# Patient Record
Sex: Female | Born: 1940 | Race: Black or African American | Hispanic: No | Smoking: Never smoker
Health system: Southern US, Community
[De-identification: ages and names within clinical notes are randomized; demographics above are authoritative.]

## PROBLEM LIST (undated history)

## (undated) DIAGNOSIS — K922 Gastrointestinal hemorrhage, unspecified: Secondary | ICD-10-CM

## (undated) DIAGNOSIS — J9610 Chronic respiratory failure, unspecified whether with hypoxia or hypercapnia: Secondary | ICD-10-CM

## (undated) DIAGNOSIS — E119 Type 2 diabetes mellitus without complications: Secondary | ICD-10-CM

## (undated) DIAGNOSIS — I82409 Acute embolism and thrombosis of unspecified deep veins of unspecified lower extremity: Secondary | ICD-10-CM

## (undated) DIAGNOSIS — E079 Disorder of thyroid, unspecified: Secondary | ICD-10-CM

## (undated) DIAGNOSIS — I482 Chronic atrial fibrillation, unspecified: Secondary | ICD-10-CM

## (undated) DIAGNOSIS — D649 Anemia, unspecified: Secondary | ICD-10-CM

## (undated) DIAGNOSIS — N189 Chronic kidney disease, unspecified: Secondary | ICD-10-CM

## (undated) DIAGNOSIS — I4891 Unspecified atrial fibrillation: Secondary | ICD-10-CM

## (undated) DIAGNOSIS — E662 Morbid (severe) obesity with alveolar hypoventilation: Secondary | ICD-10-CM

## (undated) HISTORY — PX: TRACHEOSTOMY: SUR1362

## (undated) HISTORY — PX: PEG TUBE PLACEMENT: SUR1034

---
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
## 2017-07-28 ENCOUNTER — Other Ambulatory Visit (HOSPITAL_COMMUNITY): Payer: No Typology Code available for payment source

## 2017-07-28 ENCOUNTER — Inpatient Hospital Stay
Admission: AD | Admit: 2017-07-28 | Discharge: 2017-09-06 | Disposition: A | Discharge status: Home / Self Care | Payer: No Typology Code available for payment source | Source: Ambulatory Visit | Attending: Internal Medicine | Admitting: Internal Medicine

## 2017-07-28 DIAGNOSIS — J811 Chronic pulmonary edema: Secondary | ICD-10-CM

## 2017-07-28 DIAGNOSIS — J9600 Acute respiratory failure, unspecified whether with hypoxia or hypercapnia: Secondary | ICD-10-CM

## 2017-07-28 DIAGNOSIS — R52 Pain, unspecified: Secondary | ICD-10-CM

## 2017-07-28 DIAGNOSIS — I509 Heart failure, unspecified: Secondary | ICD-10-CM

## 2017-07-28 DIAGNOSIS — J9690 Respiratory failure, unspecified, unspecified whether with hypoxia or hypercapnia: Secondary | ICD-10-CM

## 2017-07-28 LAB — CBC WITH DIFFERENTIAL/PLATELET
Basophils Absolute: 0 10*3/uL (ref 0.0–0.1)
Basophils Relative: 0 %
Eosinophils Absolute: 0.5 10*3/uL (ref 0.0–0.7)
Eosinophils Relative: 6 %
HEMATOCRIT: 28.9 % — AB (ref 36.0–46.0)
HEMOGLOBIN: 9.1 g/dL — AB (ref 12.0–15.0)
LYMPHS ABS: 1.2 10*3/uL (ref 0.7–4.0)
Lymphocytes Relative: 16 %
MCH: 27.3 pg (ref 26.0–34.0)
MCHC: 31.5 g/dL (ref 30.0–36.0)
MCV: 86.8 fL (ref 78.0–100.0)
MONOS PCT: 10 %
Monocytes Absolute: 0.8 10*3/uL (ref 0.1–1.0)
NEUTROS ABS: 5 10*3/uL (ref 1.7–7.7)
NEUTROS PCT: 68 %
Platelets: 175 10*3/uL (ref 150–400)
RBC: 3.33 MIL/uL — AB (ref 3.87–5.11)
RDW: 15.9 % — ABNORMAL HIGH (ref 11.5–15.5)
WBC: 7.4 10*3/uL (ref 4.0–10.5)

## 2017-07-28 LAB — PROTIME-INR
INR: 1.54
Prothrombin Time: 18.7 seconds — ABNORMAL HIGH (ref 11.4–15.2)

## 2017-07-28 LAB — T4, FREE: FREE T4: 1.25 ng/dL — AB (ref 0.61–1.12)

## 2017-07-28 LAB — COMPREHENSIVE METABOLIC PANEL
ALT: 23 U/L (ref 14–54)
ANION GAP: 9 (ref 5–15)
AST: 27 U/L (ref 15–41)
Albumin: 3.5 g/dL (ref 3.5–5.0)
Alkaline Phosphatase: 150 U/L — ABNORMAL HIGH (ref 38–126)
BUN: 51 mg/dL — ABNORMAL HIGH (ref 6–20)
CHLORIDE: 107 mmol/L (ref 101–111)
CO2: 26 mmol/L (ref 22–32)
Calcium: 8.6 mg/dL — ABNORMAL LOW (ref 8.9–10.3)
Creatinine, Ser: 2.56 mg/dL — ABNORMAL HIGH (ref 0.44–1.00)
GFR calc non Af Amer: 17 mL/min — ABNORMAL LOW (ref >60)
GFR, EST AFRICAN AMERICAN: 20 mL/min — AB (ref >60)
Glucose, Bld: 107 mg/dL — ABNORMAL HIGH (ref 65–99)
POTASSIUM: 4.4 mmol/L (ref 3.5–5.1)
SODIUM: 142 mmol/L (ref 135–145)
Total Bilirubin: 0.5 mg/dL (ref 0.3–1.2)
Total Protein: 7 g/dL (ref 6.5–8.1)

## 2017-07-28 LAB — HEMOGLOBIN A1C
HEMOGLOBIN A1C: 5.8 % — AB (ref 4.8–5.6)
MEAN PLASMA GLUCOSE: 119.76 mg/dL

## 2017-07-28 LAB — TSH: TSH: 1.923 u[IU]/mL (ref 0.350–4.500)

## 2017-07-29 LAB — BLOOD GAS, ARTERIAL
ACID-BASE EXCESS: 1.6 mmol/L (ref 0.0–2.0)
BICARBONATE: 26.8 mmol/L (ref 20.0–28.0)
FIO2: 30
O2 Saturation: 98.4 %
PEEP: 10 cmH2O
PH ART: 7.34 — AB (ref 7.350–7.450)
Patient temperature: 98.6
RATE: 18 resp/min
VT: 480 mL
pCO2 arterial: 51 mmHg — ABNORMAL HIGH (ref 32.0–48.0)
pO2, Arterial: 115 mmHg — ABNORMAL HIGH (ref 83.0–108.0)

## 2017-07-29 LAB — PROTIME-INR
INR: 1.45
PROTHROMBIN TIME: 17.7 s — AB (ref 11.4–15.2)

## 2017-07-30 LAB — PROTIME-INR
INR: 1.41
Prothrombin Time: 17.4 seconds — ABNORMAL HIGH (ref 11.4–15.2)

## 2017-07-31 LAB — BASIC METABOLIC PANEL
ANION GAP: 8 (ref 5–15)
BUN: 44 mg/dL — AB (ref 6–20)
CO2: 27 mmol/L (ref 22–32)
Calcium: 8.3 mg/dL — ABNORMAL LOW (ref 8.9–10.3)
Chloride: 106 mmol/L (ref 101–111)
Creatinine, Ser: 2.58 mg/dL — ABNORMAL HIGH (ref 0.44–1.00)
GFR calc Af Amer: 20 mL/min — ABNORMAL LOW (ref >60)
GFR, EST NON AFRICAN AMERICAN: 17 mL/min — AB (ref >60)
GLUCOSE: 88 mg/dL (ref 65–99)
POTASSIUM: 4 mmol/L (ref 3.5–5.1)
Sodium: 141 mmol/L (ref 135–145)

## 2017-07-31 LAB — PROTIME-INR
INR: 1.33
PROTHROMBIN TIME: 16.6 s — AB (ref 11.4–15.2)

## 2017-07-31 NOTE — Consult Note (Signed)
[facility] CONSULT NOTE    Date: 07/31/2017                  Patient Name:  Shirley Malone  MRN: [id number]  DOB: July 20, 1941  Age / Sex: 76 y.o., female         PCP: System, Pcp Not In                 Service Requesting Consult: Hospitalist                 Reason for Consult: Chronic kidney disease stage IV             History of Present Illness: Patient is a 76 y.o. 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 The patient was recently admitted to [facility] in [place] from 07/18/2017 to 07/28/2017.  Patient had 4 recent falls at home. The patient's family was no longer able to provide support for the patient. When she was admitted to the outside hospital she was found have an Escherichia coli UTI which was treated with Rocephin. She also had an unexpected acute on chronic respiratory arrest with brief cardiac arrest on 07/23/2017 due to malfunctioning of the ventilator tubing. She was subsequently able to recuperate from this. We are asked to see the patient for evaluation management of chronic kidney disease stage IV. She does follow with an outpatient nephrologist. Her baseline EGFR appears to be 21 and at the moment her renal function appears to be stable.   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  Social History: Married, 4 children, used to work with chemicals.  Review of Systems: Review of Systems  Constitutional: Positive for malaise/fatigue. Negative for chills and fever.  HENT: Negative for ear discharge and tinnitus.   Eyes: Negative for blurred vision, double vision and photophobia.  Respiratory: Positive for shortness of breath. Negative for cough.   Cardiovascular: Positive for PND. Negative for chest pain and palpitations.  Gastrointestinal: Negative for heartburn, nausea and vomiting.  Genitourinary: Negative for dysuria, frequency and urgency.  Musculoskeletal: Negative for back pain and myalgias.  Skin: Negative for itching and rash.  Neurological: Positive for weakness. Negative for dizziness and focal weakness.  Endo/Heme/Allergies: Negative for polydipsia. Does not bruise/bleed easily.  Psychiatric/Behavioral: Negative for depression. The patient is nervous/anxious.      Vital Signs: Temperature 97.9 pulse 80 respirations 34 blood pressure 152/86  Weight trends: There were no vitals filed for  this visit.  Physical Exam: General: NAD, sitting up in chair  Head: Normocephalic, atraumatic.  Eyes: Anicteric, EOMI  Nose: Mucous membranes moist, not inflammed, nonerythematous.  Throat: Oropharynx nonerythematous, no exudate appreciated.   Neck: Tracheostomy in place    Lungs:  Normal respiratory effort. Clear to auscultation BL without crackles or wheezes.  Heart: RRR. S1 and S2 normal without gallop, murmur, or rubs.  Abdomen:  BS normoactive. Soft, Nondistended, non-tender.  No masses or organomegaly.  Extremities: No pretibial edema.  Neurologic: A&O X3, Motor strength is 5/5 in the all 4 extremities  Skin: No visible rashes, scars.    Lab results: Basic Metabolic Panel:  Recent Labs Lab 07/28/17 1443 07/31/17 0254  NA 142 141  K 4.4 4.0  CL 107 106  CO2 26 27  GLUCOSE 107* 88  BUN 51* 44*  CREATININE 2.56* 2.58*  CALCIUM 8.6* 8.3*    Liver Function Tests:  Recent Labs Lab 07/28/17 1443  AST 27  ALT 23  ALKPHOS 150*  BILITOT 0.5  PROT 7.0  ALBUMIN 3.5   No results for input(s): LIPASE, AMYLASE in the last 168 hours. No results for input(s): AMMONIA in the last 168 hours.  CBC:  Recent Labs Lab 07/28/17 1443  WBC 7.4  NEUTROABS 5.0  HGB 9.1*  HCT 28.9*  MCV 86.8  PLT 175    Cardiac Enzymes: No results for input(s): CKTOTAL, CKMB, CKMBINDEX, TROPONINI in the last 168 hours.  BNP: Invalid input(s): POCBNP  CBG: No results for input(s): GLUCAP in the last 168 hours.  Microbiology: No results found for this or any previous visit.  Coagulation Studies:  Recent Labs  07/29/17 0532 07/30/17 0531 07/31/17 0254  LABPROT 17.7* 17.4* 16.6*  INR 1.45 1.41 1.33    Urinalysis: No results for input(s): COLORURINE, LABSPEC, PHURINE, GLUCOSEU, HGBUR, BILIRUBINUR, KETONESUR, PROTEINUR, UROBILINOGEN, NITRITE, LEUKOCYTESUR in the last 72 hours.  Invalid input(s): APPERANCEUR    Imaging:  No results found.   Assessment & Plan: Pt is a 76 y.o. 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   Patient was originally admitted for Escherichia coli UTI and falls. She suffered a respiratory and cardiac arrest while at outside hospital. We are asked to see the patient for underlying chronic kidney disease stage IV.  1. Chronic kidney disease stage IV. At the moment it appears that the patient's chronic kidney disease is stable. Her baseline creatinine appears to be 2.5 with an EGFR 21. Therefore no indication for dialysis at the moment. We will continue to monitor renal function closely over the course of the hospitalization.  2. Anemia chronic kidney disease. Hemoglobin currently 9.1. Hold off on Aranesp for now. Continue to monitor CBC periodically.  3. Hypertension. Maintain the patient on amlodipine and metoprolol for now.  4. Acute respiratory failure. The patient had an acute respiratory failure event at the outside hospital. She has a tracheostomy in place which appears to be function well. She's had a tracheostomy in place since 2016.  5. Thanks for consultation.

## 2017-08-01 LAB — PROTIME-INR
INR: 1.37
PROTHROMBIN TIME: 17 s — AB (ref 11.4–15.2)

## 2017-08-02 LAB — PROTIME-INR
INR: 1.35
PROTHROMBIN TIME: 16.7 s — AB (ref 11.4–15.2)

## 2017-08-02 NOTE — Progress Notes (Signed)
  [facility]  ROUNDING NOTE   Subjective:  Renal function appears to be stable. Creatinine today is 2.5. Patient resting comfortably at the moment.   Objective:  Vital signs in last 24 hours:  Temperature 97.8 pulse 88 respirations 24 blood pressure 143/81  Physical Exam: General: No acute distress  Head: Normocephalic, atraumatic. Moist oral mucosal membranes  Eyes: Anicteric  Neck: Tracheostomy in place   Lungs:  Clear to auscultation, normal effort  Heart: S1S2 no rubs  Abdomen:  Soft, nontender, bowel sounds present  Extremities: Trace peripheral edema.  Neurologic: Awake, alert, following commands  Skin: No lesions       Basic Metabolic Panel:  Recent Labs Lab 07/28/17 1443 07/31/17 0254  NA 142 141  K 4.4 4.0  CL 107 106  CO2 26 27  GLUCOSE 107* 88  BUN 51* 44*  CREATININE 2.56* 2.58*  CALCIUM 8.6* 8.3*    Liver Function Tests:  Recent Labs Lab 07/28/17 1443  AST 27  ALT 23  ALKPHOS 150*  BILITOT 0.5  PROT 7.0  ALBUMIN 3.5   No results for input(s): LIPASE, AMYLASE in the last 168 hours. No results for input(s): AMMONIA in the last 168 hours.  CBC:  Recent Labs Lab 07/28/17 1443  WBC 7.4  NEUTROABS 5.0  HGB 9.1*  HCT 28.9*  MCV 86.8  PLT 175    Cardiac Enzymes: No results for input(s): CKTOTAL, CKMB, CKMBINDEX, TROPONINI in the last 168 hours.  BNP: Invalid input(s): POCBNP  CBG: No results for input(s): GLUCAP in the last 168 hours.  Microbiology: No results found for this or any previous visit.  Coagulation Studies:  Recent Labs  07/31/17 0254 08/01/17 0525 08/02/17 0407  LABPROT 16.6* 17.0* 16.7*  INR 1.33 1.37 1.35    Urinalysis: No results for input(s): COLORURINE, LABSPEC, PHURINE, GLUCOSEU, HGBUR, BILIRUBINUR, KETONESUR, PROTEINUR, UROBILINOGEN, NITRITE, LEUKOCYTESUR in the last 72 hours.  Invalid input(s): APPERANCEUR    Imaging: No results found.   Medications:       Assessment/  Plan:  76 y.o. 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   Patient was originally admitted for Escherichia coli UTI and falls. She suffered a respiratory and cardiac arrest while at outside hospital. We are asked to see the patient for underlying chronic kidney disease stage IV.  1. Chronic kidney disease stage IV. Chronic kidney disease remains stable at [facility] a creatinine of 2.5 and EGFR 21. We will continue to monitor renal function periodically.  2. Anemia chronic kidney disease. Hold off on Aranesp for now.  3. Hypertension. Blood pressure under reasonable control at the moment. Continue current doses of amlodipine and metoprolol.  4. Acute respiratory failure. Patient appears to be breathing comfortably with tracheostomy in place. Management as per hospitalist.   LOS: 0 Shirley Malone 8/22/20184:32 PM

## 2017-08-03 LAB — PROTIME-INR
INR: 1.42
Prothrombin Time: 17.5 seconds — ABNORMAL HIGH (ref 11.4–15.2)

## 2017-08-04 LAB — PROTIME-INR
INR: 1.4
PROTHROMBIN TIME: 17.2 s — AB (ref 11.4–15.2)

## 2017-08-04 NOTE — Progress Notes (Signed)
Patient's renal function has been stable, she has underlying stage IV CKD.  No further input at this time, please call back if renal function worse.

## 2017-08-05 LAB — PROTIME-INR
INR: 1.38
PROTHROMBIN TIME: 17.1 s — AB (ref 11.4–15.2)

## 2017-08-06 LAB — CBC
HCT: 26 % — ABNORMAL LOW (ref 36.0–46.0)
HEMOGLOBIN: 7.9 g/dL — AB (ref 12.0–15.0)
MCH: 26.4 pg (ref 26.0–34.0)
MCHC: 30.4 g/dL (ref 30.0–36.0)
MCV: 87 fL (ref 78.0–100.0)
PLATELETS: 135 10*3/uL — AB (ref 150–400)
RBC: 2.99 MIL/uL — AB (ref 3.87–5.11)
RDW: 15.8 % — ABNORMAL HIGH (ref 11.5–15.5)
WBC: 6.6 10*3/uL (ref 4.0–10.5)

## 2017-08-06 LAB — BASIC METABOLIC PANEL
ANION GAP: 9 (ref 5–15)
BUN: 40 mg/dL — AB (ref 6–20)
CHLORIDE: 107 mmol/L (ref 101–111)
CO2: 26 mmol/L (ref 22–32)
Calcium: 8.7 mg/dL — ABNORMAL LOW (ref 8.9–10.3)
Creatinine, Ser: 2.7 mg/dL — ABNORMAL HIGH (ref 0.44–1.00)
GFR calc Af Amer: 19 mL/min — ABNORMAL LOW (ref >60)
GFR calc non Af Amer: 16 mL/min — ABNORMAL LOW (ref >60)
GLUCOSE: 93 mg/dL (ref 65–99)
POTASSIUM: 3.9 mmol/L (ref 3.5–5.1)
Sodium: 142 mmol/L (ref 135–145)

## 2017-08-06 LAB — PROTIME-INR
INR: 1.44
Prothrombin Time: 17.7 seconds — ABNORMAL HIGH (ref 11.4–15.2)

## 2017-08-06 LAB — TSH: TSH: 1.914 u[IU]/mL (ref 0.350–4.500)

## 2017-08-07 LAB — PROTIME-INR
INR: 1.42
Prothrombin Time: 17.5 seconds — ABNORMAL HIGH (ref 11.4–15.2)

## 2017-08-08 LAB — PROTIME-INR
INR: 1.51
PROTHROMBIN TIME: 18.4 s — AB (ref 11.4–15.2)

## 2017-08-09 LAB — BLOOD GAS, ARTERIAL
Acid-base deficit: 2.6 mmol/L — ABNORMAL HIGH (ref 0.0–2.0)
BICARBONATE: 23.6 mmol/L (ref 20.0–28.0)
FIO2: 28
O2 SAT: 96.9 %
PATIENT TEMPERATURE: 98.6
PO2 ART: 98.3 mmHg (ref 83.0–108.0)
pCO2 arterial: 55.3 mmHg — ABNORMAL HIGH (ref 32.0–48.0)
pH, Arterial: 7.253 — ABNORMAL LOW (ref 7.350–7.450)

## 2017-08-09 LAB — PROTIME-INR
INR: 1.52
PROTHROMBIN TIME: 18.1 s — AB (ref 11.4–15.2)

## 2017-08-10 LAB — PROTIME-INR
INR: 1.86
Prothrombin Time: 21.3 seconds — ABNORMAL HIGH (ref 11.4–15.2)

## 2017-08-11 LAB — BASIC METABOLIC PANEL
Anion gap: 12 (ref 5–15)
BUN: 59 mg/dL — AB (ref 6–20)
CHLORIDE: 110 mmol/L (ref 101–111)
CO2: 21 mmol/L — AB (ref 22–32)
Calcium: 8.8 mg/dL — ABNORMAL LOW (ref 8.9–10.3)
Creatinine, Ser: 2.61 mg/dL — ABNORMAL HIGH (ref 0.44–1.00)
GFR calc Af Amer: 20 mL/min — ABNORMAL LOW (ref >60)
GFR calc non Af Amer: 17 mL/min — ABNORMAL LOW (ref >60)
Glucose, Bld: 97 mg/dL (ref 65–99)
POTASSIUM: 4.3 mmol/L (ref 3.5–5.1)
SODIUM: 143 mmol/L (ref 135–145)

## 2017-08-11 LAB — CBC
HEMATOCRIT: 27.8 % — AB (ref 36.0–46.0)
Hemoglobin: 8.4 g/dL — ABNORMAL LOW (ref 12.0–15.0)
MCH: 26.4 pg (ref 26.0–34.0)
MCHC: 30.2 g/dL (ref 30.0–36.0)
MCV: 87.4 fL (ref 78.0–100.0)
Platelets: 172 10*3/uL (ref 150–400)
RBC: 3.18 MIL/uL — ABNORMAL LOW (ref 3.87–5.11)
RDW: 16 % — AB (ref 11.5–15.5)
WBC: 5.6 10*3/uL (ref 4.0–10.5)

## 2017-08-11 LAB — PROTIME-INR
INR: 1.87
Prothrombin Time: 21.3 seconds — ABNORMAL HIGH (ref 11.4–15.2)

## 2017-08-12 ENCOUNTER — Other Ambulatory Visit (HOSPITAL_COMMUNITY): Payer: No Typology Code available for payment source

## 2017-08-12 LAB — BLOOD GAS, ARTERIAL
ACID-BASE EXCESS: 1.4 mmol/L (ref 0.0–2.0)
BICARBONATE: 25.9 mmol/L (ref 20.0–28.0)
DRAWN BY: 524757
FIO2: 21
LHR: 18 {breaths}/min
MECHVT: 480 mL
O2 SAT: 91.6 %
PCO2 ART: 44.6 mmHg (ref 32.0–48.0)
PEEP/CPAP: 10 cmH2O
PO2 ART: 63.9 mmHg — AB (ref 83.0–108.0)
Patient temperature: 98.6
pH, Arterial: 7.383 (ref 7.350–7.450)

## 2017-08-12 LAB — PROTIME-INR
INR: 2.38
PROTHROMBIN TIME: 25.8 s — AB (ref 11.4–15.2)

## 2017-08-13 LAB — CBC
HCT: 28.6 % — ABNORMAL LOW (ref 36.0–46.0)
Hemoglobin: 8.8 g/dL — ABNORMAL LOW (ref 12.0–15.0)
MCH: 25.9 pg — AB (ref 26.0–34.0)
MCHC: 30.8 g/dL (ref 30.0–36.0)
MCV: 84.1 fL (ref 78.0–100.0)
PLATELETS: 170 10*3/uL (ref 150–400)
RBC: 3.4 MIL/uL — ABNORMAL LOW (ref 3.87–5.11)
RDW: 15.8 % — AB (ref 11.5–15.5)
WBC: 6.3 10*3/uL (ref 4.0–10.5)

## 2017-08-13 LAB — BASIC METABOLIC PANEL
Anion gap: 10 (ref 5–15)
BUN: 57 mg/dL — AB (ref 6–20)
CALCIUM: 9.2 mg/dL (ref 8.9–10.3)
CO2: 25 mmol/L (ref 22–32)
CREATININE: 2.37 mg/dL — AB (ref 0.44–1.00)
Chloride: 109 mmol/L (ref 101–111)
GFR calc non Af Amer: 19 mL/min — ABNORMAL LOW (ref >60)
GFR, EST AFRICAN AMERICAN: 22 mL/min — AB (ref >60)
Glucose, Bld: 85 mg/dL (ref 65–99)
Potassium: 3.9 mmol/L (ref 3.5–5.1)
Sodium: 144 mmol/L (ref 135–145)

## 2017-08-13 LAB — PROTIME-INR
INR: 2.32
Prothrombin Time: 25.3 seconds — ABNORMAL HIGH (ref 11.4–15.2)

## 2017-08-14 LAB — PROTIME-INR
INR: 2.16
Prothrombin Time: 23.9 seconds — ABNORMAL HIGH (ref 11.4–15.2)

## 2017-08-15 LAB — PROTIME-INR
INR: 1.91
PROTHROMBIN TIME: 21.7 s — AB (ref 11.4–15.2)

## 2017-08-16 ENCOUNTER — Other Ambulatory Visit (HOSPITAL_COMMUNITY): Payer: No Typology Code available for payment source

## 2017-08-16 LAB — PROTIME-INR
INR: 1.77
Prothrombin Time: 20.5 seconds — ABNORMAL HIGH (ref 11.4–15.2)

## 2017-08-17 LAB — PROTIME-INR
INR: 1.44
Prothrombin Time: 17.4 seconds — ABNORMAL HIGH (ref 11.4–15.2)

## 2017-08-18 LAB — PROTIME-INR
INR: 1.61
PROTHROMBIN TIME: 19 s — AB (ref 11.4–15.2)

## 2017-08-19 LAB — PROTIME-INR
INR: 1.71
Prothrombin Time: 19.9 seconds — ABNORMAL HIGH (ref 11.4–15.2)

## 2017-08-20 LAB — PROTIME-INR
INR: 1.79
Prothrombin Time: 20.6 seconds — ABNORMAL HIGH (ref 11.4–15.2)

## 2017-08-21 LAB — CBC
HEMATOCRIT: 27.8 % — AB (ref 36.0–46.0)
HEMOGLOBIN: 8.3 g/dL — AB (ref 12.0–15.0)
MCH: 26.1 pg (ref 26.0–34.0)
MCHC: 29.9 g/dL — AB (ref 30.0–36.0)
MCV: 87.4 fL (ref 78.0–100.0)
Platelets: 175 10*3/uL (ref 150–400)
RBC: 3.18 MIL/uL — ABNORMAL LOW (ref 3.87–5.11)
RDW: 15.9 % — ABNORMAL HIGH (ref 11.5–15.5)
WBC: 11.5 10*3/uL — ABNORMAL HIGH (ref 4.0–10.5)

## 2017-08-21 LAB — BASIC METABOLIC PANEL
Anion gap: 8 (ref 5–15)
BUN: 94 mg/dL — ABNORMAL HIGH (ref 6–20)
CO2: 24 mmol/L (ref 22–32)
Calcium: 8.8 mg/dL — ABNORMAL LOW (ref 8.9–10.3)
Chloride: 104 mmol/L (ref 101–111)
Creatinine, Ser: 3 mg/dL — ABNORMAL HIGH (ref 0.44–1.00)
GFR calc Af Amer: 17 mL/min — ABNORMAL LOW (ref >60)
GFR, EST NON AFRICAN AMERICAN: 14 mL/min — AB (ref >60)
GLUCOSE: 122 mg/dL — AB (ref 65–99)
POTASSIUM: 4.7 mmol/L (ref 3.5–5.1)
Sodium: 136 mmol/L (ref 135–145)

## 2017-08-21 LAB — PROTIME-INR
INR: 1.87
Prothrombin Time: 21.4 seconds — ABNORMAL HIGH (ref 11.4–15.2)

## 2017-08-22 LAB — PROTIME-INR
INR: 2
PROTHROMBIN TIME: 22.5 s — AB (ref 11.4–15.2)

## 2017-08-23 LAB — BLOOD GAS, ARTERIAL
Acid-base deficit: 3.5 mmol/L — ABNORMAL HIGH (ref 0.0–2.0)
BICARBONATE: 20.8 mmol/L (ref 20.0–28.0)
FIO2: 28
O2 Saturation: 99.1 %
PH ART: 7.376 (ref 7.350–7.450)
PO2 ART: 182 mmHg — AB (ref 83.0–108.0)
Patient temperature: 98.6
pCO2 arterial: 36.3 mmHg (ref 32.0–48.0)

## 2017-08-23 LAB — BASIC METABOLIC PANEL
Anion gap: 6 (ref 5–15)
BUN: 87 mg/dL — ABNORMAL HIGH (ref 6–20)
CALCIUM: 8.8 mg/dL — AB (ref 8.9–10.3)
CO2: 24 mmol/L (ref 22–32)
CREATININE: 2.58 mg/dL — AB (ref 0.44–1.00)
Chloride: 111 mmol/L (ref 101–111)
GFR calc Af Amer: 20 mL/min — ABNORMAL LOW (ref >60)
GFR calc non Af Amer: 17 mL/min — ABNORMAL LOW (ref >60)
GLUCOSE: 94 mg/dL (ref 65–99)
Potassium: 4.1 mmol/L (ref 3.5–5.1)
Sodium: 141 mmol/L (ref 135–145)

## 2017-08-23 LAB — PROTIME-INR
INR: 2.65
INR: 2.91
PROTHROMBIN TIME: 28.1 s — AB (ref 11.4–15.2)
Prothrombin Time: 30.2 seconds — ABNORMAL HIGH (ref 11.4–15.2)

## 2017-08-24 ENCOUNTER — Other Ambulatory Visit (HOSPITAL_COMMUNITY): Payer: No Typology Code available for payment source

## 2017-08-24 LAB — CBC
HEMATOCRIT: 27.2 % — AB (ref 36.0–46.0)
Hemoglobin: 8.1 g/dL — ABNORMAL LOW (ref 12.0–15.0)
MCH: 25.6 pg — ABNORMAL LOW (ref 26.0–34.0)
MCHC: 29.8 g/dL — ABNORMAL LOW (ref 30.0–36.0)
MCV: 85.8 fL (ref 78.0–100.0)
Platelets: 163 10*3/uL (ref 150–400)
RBC: 3.17 MIL/uL — AB (ref 3.87–5.11)
RDW: 16.3 % — ABNORMAL HIGH (ref 11.5–15.5)
WBC: 9.7 10*3/uL (ref 4.0–10.5)

## 2017-08-24 LAB — BASIC METABOLIC PANEL
ANION GAP: 10 (ref 5–15)
BUN: 92 mg/dL — ABNORMAL HIGH (ref 6–20)
CO2: 22 mmol/L (ref 22–32)
Calcium: 8.7 mg/dL — ABNORMAL LOW (ref 8.9–10.3)
Chloride: 107 mmol/L (ref 101–111)
Creatinine, Ser: 2.82 mg/dL — ABNORMAL HIGH (ref 0.44–1.00)
GFR calc Af Amer: 18 mL/min — ABNORMAL LOW (ref >60)
GFR calc non Af Amer: 15 mL/min — ABNORMAL LOW (ref >60)
GLUCOSE: 105 mg/dL — AB (ref 65–99)
POTASSIUM: 4.5 mmol/L (ref 3.5–5.1)
Sodium: 139 mmol/L (ref 135–145)

## 2017-08-24 LAB — PROTIME-INR
INR: 3.23
PROTHROMBIN TIME: 32.8 s — AB (ref 11.4–15.2)

## 2017-08-25 LAB — BLOOD GAS, ARTERIAL
Acid-base deficit: 3.5 mmol/L — ABNORMAL HIGH (ref 0.0–2.0)
BICARBONATE: 23.2 mmol/L (ref 20.0–28.0)
FIO2: 28
O2 Saturation: 76.6 %
PATIENT TEMPERATURE: 98.6
PH ART: 7.216 — AB (ref 7.350–7.450)
pCO2 arterial: 59.5 mmHg — ABNORMAL HIGH (ref 32.0–48.0)
pO2, Arterial: 46.9 mmHg — ABNORMAL LOW (ref 83.0–108.0)

## 2017-08-25 LAB — PROTIME-INR
INR: 3.2
PROTHROMBIN TIME: 32.5 s — AB (ref 11.4–15.2)

## 2017-08-25 LAB — TROPONIN I: Troponin I: 0.03 ng/mL (ref <0.03)

## 2017-08-25 MED FILL — Medication: Qty: 1 | Status: AC

## 2017-08-26 ENCOUNTER — Other Ambulatory Visit (HOSPITAL_COMMUNITY): Payer: No Typology Code available for payment source

## 2017-08-26 LAB — URINALYSIS, ROUTINE W REFLEX MICROSCOPIC
BILIRUBIN URINE: NEGATIVE
GLUCOSE, UA: NEGATIVE mg/dL
KETONES UR: NEGATIVE mg/dL
NITRITE: NEGATIVE
PH: 5 (ref 5.0–8.0)
Protein, ur: NEGATIVE mg/dL
Specific Gravity, Urine: 1.01 (ref 1.005–1.030)

## 2017-08-26 LAB — BASIC METABOLIC PANEL
Anion gap: 12 (ref 5–15)
BUN: 112 mg/dL — ABNORMAL HIGH (ref 6–20)
CALCIUM: 9 mg/dL (ref 8.9–10.3)
CO2: 20 mmol/L — AB (ref 22–32)
Chloride: 109 mmol/L (ref 101–111)
Creatinine, Ser: 3.84 mg/dL — ABNORMAL HIGH (ref 0.44–1.00)
GFR calc Af Amer: 12 mL/min — ABNORMAL LOW (ref >60)
GFR calc non Af Amer: 11 mL/min — ABNORMAL LOW (ref >60)
GLUCOSE: 128 mg/dL — AB (ref 65–99)
Potassium: 5 mmol/L (ref 3.5–5.1)
SODIUM: 141 mmol/L (ref 135–145)

## 2017-08-26 LAB — PROTIME-INR
INR: 4.95
Prothrombin Time: 45.7 seconds — ABNORMAL HIGH (ref 11.4–15.2)

## 2017-08-27 ENCOUNTER — Other Ambulatory Visit (HOSPITAL_COMMUNITY): Payer: No Typology Code available for payment source

## 2017-08-27 LAB — PROTIME-INR
INR: 6.61
Prothrombin Time: 57.3 seconds — ABNORMAL HIGH (ref 11.4–15.2)

## 2017-08-28 ENCOUNTER — Other Ambulatory Visit (HOSPITAL_COMMUNITY): Payer: No Typology Code available for payment source

## 2017-08-28 LAB — BLOOD GAS, ARTERIAL
Acid-base deficit: 0.1 mmol/L (ref 0.0–2.0)
BICARBONATE: 25.3 mmol/L (ref 20.0–28.0)
FIO2: 28
LHR: 18 {breaths}/min
MECHVT: 480 mL
O2 Saturation: 96.2 %
PEEP/CPAP: 5 cmH2O
Patient temperature: 98.6
pCO2 arterial: 51.2 mmHg — ABNORMAL HIGH (ref 32.0–48.0)
pH, Arterial: 7.315 — ABNORMAL LOW (ref 7.350–7.450)
pO2, Arterial: 90.5 mmHg (ref 83.0–108.0)

## 2017-08-28 LAB — URINE CULTURE

## 2017-08-28 LAB — BASIC METABOLIC PANEL
ANION GAP: 7 (ref 5–15)
BUN: 118 mg/dL — AB (ref 6–20)
CO2: 26 mmol/L (ref 22–32)
Calcium: 9.2 mg/dL (ref 8.9–10.3)
Chloride: 111 mmol/L (ref 101–111)
Creatinine, Ser: 3.28 mg/dL — ABNORMAL HIGH (ref 0.44–1.00)
GFR calc Af Amer: 15 mL/min — ABNORMAL LOW (ref >60)
GFR calc non Af Amer: 13 mL/min — ABNORMAL LOW (ref >60)
GLUCOSE: 134 mg/dL — AB (ref 65–99)
POTASSIUM: 4.9 mmol/L (ref 3.5–5.1)
Sodium: 144 mmol/L (ref 135–145)

## 2017-08-28 LAB — CBC
HEMATOCRIT: 28.7 % — AB (ref 36.0–46.0)
Hemoglobin: 8.8 g/dL — ABNORMAL LOW (ref 12.0–15.0)
MCH: 25.9 pg — AB (ref 26.0–34.0)
MCHC: 30.7 g/dL (ref 30.0–36.0)
MCV: 84.4 fL (ref 78.0–100.0)
Platelets: 188 10*3/uL (ref 150–400)
RBC: 3.4 MIL/uL — ABNORMAL LOW (ref 3.87–5.11)
RDW: 16.3 % — AB (ref 11.5–15.5)
WBC: 9.5 10*3/uL (ref 4.0–10.5)

## 2017-08-28 LAB — PROTIME-INR
INR: 3.35
Prothrombin Time: 33.7 seconds — ABNORMAL HIGH (ref 11.4–15.2)

## 2017-08-28 NOTE — Progress Notes (Signed)
[facility]  ROUNDING NOTE   Subjective:  We are asked to see the patient again for worsening renal function in the setting of chronic kidney disease stage IV. BUN currently up to 118 with a creatinine of 3.28. She was previously on Lasix drip this has been stopped. Case discussed with hospitalist.   Objective:  Vital signs in last 24 hours:  Temperature 97.4 pulse 91 respirations 25 blood pressure 150/86  Physical Exam: General: No acute distress  Head: Normocephalic, atraumatic. Moist oral mucosal membranes  Eyes: Anicteric  Neck: Tracheostomy in place   Lungs:  Clear to auscultation, normal effort  Heart: S1S2 no rubs  Abdomen:  Soft, nontender, bowel sounds present  Extremities: Trace peripheral edema.  Neurologic: Lethargic but arousable   Skin: No lesions       Basic Metabolic Panel:  Recent Labs Lab 08/21/17 2243 08/23/17 0616 08/24/17 1423 08/26/17 0755 08/28/17 0502  NA 136 141 139 141 144  K 4.7 4.1 4.5 5.0 4.9  CL 104 111 107 109 111  CO2 '24 24 22 ' 20* 26  GLUCOSE 122* 94 105* 128* 134*  BUN 94* 87* 92* 112* 118*  CREATININE 3.00* 2.58* 2.82* 3.84* 3.28*  CALCIUM 8.8* 8.8* 8.7* 9.0 9.2    Liver Function Tests: No results for input(s): AST, ALT, ALKPHOS, BILITOT, PROT, ALBUMIN in the last 168 hours. No results for input(s): LIPASE, AMYLASE in the last 168 hours. No results for input(s): AMMONIA in the last 168 hours.  CBC:  Recent Labs Lab 08/21/17 2243 08/24/17 1423 08/28/17 0502  WBC 11.5* 9.7 9.5  HGB 8.3* 8.1* 8.8*  HCT 27.8* 27.2* 28.7*  MCV 87.4 85.8 84.4  PLT 175 163 188    Cardiac Enzymes:  Recent Labs Lab 08/25/17 1115  TROPONINI 0.03*    BNP: Invalid input(s): POCBNP  CBG: No results for input(s): GLUCAP in the last 168 hours.  Microbiology: Results for orders placed or performed during the hospital encounter of 07/28/17  Culture, Urine     Status: Abnormal   Collection Time: 08/26/17  5:06 PM  Result  Value Ref Range Status   Specimen Description URINE, RANDOM  Final   Special Requests NONE  Final   Culture >=100,000 COLONIES/mL ESCHERICHIA COLI (A)  Final   Report Status 08/28/2017 FINAL  Final   Organism ID, Bacteria ESCHERICHIA COLI (A)  Final      Susceptibility   Escherichia coli - MIC*    AMPICILLIN >=32 RESISTANT Resistant     CEFAZOLIN <=4 SENSITIVE Sensitive     CEFTRIAXONE <=1 SENSITIVE Sensitive     CIPROFLOXACIN >=4 RESISTANT Resistant     GENTAMICIN <=1 SENSITIVE Sensitive     IMIPENEM <=0.25 SENSITIVE Sensitive     NITROFURANTOIN <=16 SENSITIVE Sensitive     TRIMETH/SULFA >=320 RESISTANT Resistant     AMPICILLIN/SULBACTAM >=32 RESISTANT Resistant     PIP/TAZO 8 SENSITIVE Sensitive     Extended ESBL NEGATIVE Sensitive     * >=100,000 COLONIES/mL ESCHERICHIA COLI    Coagulation Studies:  Recent Labs  08/26/17 0755 08/27/17 0502 08/28/17 0502  LABPROT 45.7* 57.3* 33.7*  INR 4.95* 6.61* 3.35    Urinalysis:  Recent Labs  08/26/17 1706  COLORURINE YELLOW  LABSPEC 1.010  PHURINE 5.0  GLUCOSEU NEGATIVE  HGBUR SMALL*  BILIRUBINUR NEGATIVE  KETONESUR NEGATIVE  PROTEINUR NEGATIVE  NITRITE NEGATIVE  LEUKOCYTESUR TRACE*      Imaging: Dg Chest Port 1 View  Result Date: 08/27/2017 CLINICAL DATA:  Heart failure.  EXAM: PORTABLE CHEST 1 VIEW COMPARISON:  08/24/2017 FINDINGS: Cardiomegaly and tracheostomy tube again noted. Mild pulmonary vascular congestion is again identified. Improved aeration in the lung bases noted. No large pleural effusion or pneumothorax noted. IMPRESSION: Cardiomegaly with mild pulmonary vascular congestion. Improved bibasilar aeration. Electronically Signed   By: Margarette Canada M.D.   On: 08/27/2017 11:00     Medications:       Assessment/ Plan:  76 y.o. 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   Patient was originally admitted for Escherichia coli UTI and falls. She suffered a respiratory and cardiac arrest while at outside hospital. We are asked to see the patient for underlying chronic kidney disease stage IV.  1. Acute renal failure/Chronic kidney disease stage IV. Baseline EGFR is 21. Renal function considerably worse now in terms of BUN. Agree with discontinuation of Lasix drip. Provide very gentle IV fluid hydration with 0.9 normal saline at 40 cc per hour for the next day or 2.  No urgent indication for dialysis at the moment however we may need to reassess for this on Wednesday if renal function continues to deteriorate.  2. Anemia chronic kidney disease. Hemoglobin currently 8.8. Hold off on Aranesp for now.  3. Hypertension. Blood pressure currently 150/80.  Continue amlodipine, carvedilol, hydralazine  4. Acute respiratory failure. Still has functional tracheostomy in place. Management as per pulmonary/crit care.   LOS: 0 Lynda Wanninger 9/17/20184:48 PM

## 2017-08-29 LAB — BASIC METABOLIC PANEL
Anion gap: 8 (ref 5–15)
BUN: 118 mg/dL — AB (ref 6–20)
CHLORIDE: 114 mmol/L — AB (ref 101–111)
CO2: 24 mmol/L (ref 22–32)
Calcium: 9.2 mg/dL (ref 8.9–10.3)
Creatinine, Ser: 3.06 mg/dL — ABNORMAL HIGH (ref 0.44–1.00)
GFR calc Af Amer: 16 mL/min — ABNORMAL LOW (ref >60)
GFR calc non Af Amer: 14 mL/min — ABNORMAL LOW (ref >60)
GLUCOSE: 155 mg/dL — AB (ref 65–99)
POTASSIUM: 5 mmol/L (ref 3.5–5.1)
SODIUM: 146 mmol/L — AB (ref 135–145)

## 2017-08-29 LAB — PROTIME-INR
INR: 1.9
PROTHROMBIN TIME: 21.6 s — AB (ref 11.4–15.2)

## 2017-08-30 LAB — BASIC METABOLIC PANEL
Anion gap: 8 (ref 5–15)
BUN: 124 mg/dL — ABNORMAL HIGH (ref 6–20)
CHLORIDE: 111 mmol/L (ref 101–111)
CO2: 25 mmol/L (ref 22–32)
Calcium: 9.2 mg/dL (ref 8.9–10.3)
Creatinine, Ser: 3.09 mg/dL — ABNORMAL HIGH (ref 0.44–1.00)
GFR calc Af Amer: 16 mL/min — ABNORMAL LOW (ref >60)
GFR calc non Af Amer: 14 mL/min — ABNORMAL LOW (ref >60)
Glucose, Bld: 142 mg/dL — ABNORMAL HIGH (ref 65–99)
Potassium: 4.9 mmol/L (ref 3.5–5.1)
Sodium: 144 mmol/L (ref 135–145)

## 2017-08-30 LAB — PROTIME-INR
INR: 1.54
PROTHROMBIN TIME: 18.4 s — AB (ref 11.4–15.2)

## 2017-08-30 NOTE — Progress Notes (Signed)
[facility]  ROUNDING NOTE   Subjective:  Creatinine is down to 3.09 as compared to 3.28 on 08/28/2017. However BUN a bit higher at 124. However patient is much more awake and alert at the moment.   Objective:  Vital signs in last 24 hours:  Temperature 97 pulse 99 respirations 18 blood pressure 127/69  Physical Exam: General: No acute distress  Head: Normocephalic, atraumatic. Moist oral mucosal membranes  Eyes: Anicteric  Neck: Tracheostomy in place   Lungs:  Clear to auscultation, normal effort  Heart: S1S2 no rubs  Abdomen:  Soft, nontender, bowel sounds present  Extremities: Trace peripheral edema.  Neurologic: Awake, alert, following commands  Skin: No lesions       Basic Metabolic Panel:  Recent Labs Lab 08/24/17 1423 08/26/17 0755 08/28/17 0502 08/29/17 0601 08/30/17 0518  NA 139 141 144 146* 144  K 4.5 5.0 4.9 5.0 4.9  CL 107 109 111 114* 111  CO2 22 20* 26 24 25  GLUCOSE 105* 128* 134* 155* 142*  BUN 92* 112* 118* 118* 124*  CREATININE 2.82* 3.84* 3.28* 3.06* 3.09*  CALCIUM 8.7* 9.0 9.2 9.2 9.2    Liver Function Tests: No results for input(s): AST, ALT, ALKPHOS, BILITOT, PROT, ALBUMIN in the last 168 hours. No results for input(s): LIPASE, AMYLASE in the last 168 hours. No results for input(s): AMMONIA in the last 168 hours.  CBC:  Recent Labs Lab 08/24/17 1423 08/28/17 0502  WBC 9.7 9.5  HGB 8.1* 8.8*  HCT 27.2* 28.7*  MCV 85.8 84.4  PLT 163 188    Cardiac Enzymes:  Recent Labs Lab 08/25/17 1115  TROPONINI 0.03*    BNP: Invalid input(s): POCBNP  CBG: No results for input(s): GLUCAP in the last 168 hours.  Microbiology: Results for orders placed or performed during the hospital encounter of 07/28/17  Culture, Urine     Status: Abnormal   Collection Time: 08/26/17  5:06 PM  Result Value Ref Range Status   Specimen Description URINE, RANDOM  Final   Special Requests NONE  Final   Culture >=100,000 COLONIES/mL  ESCHERICHIA COLI (A)  Final   Report Status 08/28/2017 FINAL  Final   Organism ID, Bacteria ESCHERICHIA COLI (A)  Final      Susceptibility   Escherichia coli - MIC*    AMPICILLIN >=32 RESISTANT Resistant     CEFAZOLIN <=4 SENSITIVE Sensitive     CEFTRIAXONE <=1 SENSITIVE Sensitive     CIPROFLOXACIN >=4 RESISTANT Resistant     GENTAMICIN <=1 SENSITIVE Sensitive     IMIPENEM <=0.25 SENSITIVE Sensitive     NITROFURANTOIN <=16 SENSITIVE Sensitive     TRIMETH/SULFA >=320 RESISTANT Resistant     AMPICILLIN/SULBACTAM >=32 RESISTANT Resistant     PIP/TAZO 8 SENSITIVE Sensitive     Extended ESBL NEGATIVE Sensitive     * >=100,000 COLONIES/mL ESCHERICHIA COLI    Coagulation Studies:  Recent Labs  08/28/17 0502 08/29/17 0601 08/30/17 0518  LABPROT 33.7* 21.6* 18.4*  INR 3.35 1.90 1.54    Urinalysis: No results for input(s): COLORURINE, LABSPEC, PHURINE, GLUCOSEU, HGBUR, BILIRUBINUR, KETONESUR, PROTEINUR, UROBILINOGEN, NITRITE, LEUKOCYTESUR in the last 72 hours.  Invalid input(s): APPERANCEUR    Imaging: Ct Head Wo Contrast  Result Date: 08/28/2017 CLINICAL DATA:  Altered mental status. EXAM: CT HEAD WITHOUT CONTRAST TECHNIQUE: Contiguous axial images were obtained from the base of the skull through the vertex without intravenous contrast. COMPARISON:  None. FINDINGS: Brain: Characterization limited by patient motion artifact. No mass, hemorrhage,   edema or other evidence of acute parenchymal abnormality seen. Ventricles are within normal limits in size and configuration. Vascular: There are chronic calcified atherosclerotic changes of the large vessels at the skull base. No unexpected hyperdense vessel. Skull: Normal. Negative for fracture or focal lesion. Sinuses/Orbits: No acute findings. Sinuses are clear. Questionable proptosis bilaterally. No retro-orbital edema or mass. Other: None. IMPRESSION: 1. Study somewhat limited by patient motion artifact. No acute findings seen. No mass,  hemorrhage, edema or other evidence of acute parenchymal abnormality. 2. Questionable proptosis bilaterally, of uncertain chronicity or significance. Electronically Signed   By: Franki Cabot M.D.   On: 08/28/2017 16:48     Medications:       Assessment/ Plan:  76 y.o. female with a PMHx of Chronic kidney disease stage IV baseline EGFR 21, anemia of chronic kidney disease, morbid obesity, chronic tracheostomy with chronic respiratory failure, chronic atrial fibrillation, hypertension, diabetes mellitus type 2, obstructive sleep apnea, GERD, hypothyroidism who was admitted to [place] on 07/28/2017 for for treatment of ongoing debility, respiratory failure, chronic kidney disease stage IV.   Patient was originally admitted for Escherichia coli UTI and falls. She suffered a respiratory and cardiac arrest while at outside hospital. We are asked to see the patient for underlying chronic kidney disease stage IV.  1. Acute renal failure/Chronic kidney disease stage IV. Baseline EGFR is 21. Renal function considerably worse now in terms of BUN. -  Continue to hold diuretics at this time. Creatinine currently 3.09 which is lower than at the time of our last evaluation at which point in time creatinine was 3.2. Patient is still making urine. Therefore no urgent indication to start dialysis at the moment. Her mental status has also improved.  2. Anemia chronic kidney disease. Continue to periodically monitor CBC.  3. Hypertension. Blood pressure at target at 127/69. Continue current antihypertensives.  4. Acute respiratory failure. Tracheostomy continues to function well. Respiratory status appears to be stable at the moment.   LOS: 0 Lorenna Lurry 9/19/201811:36 AM

## 2017-08-31 LAB — PROTIME-INR
INR: 1.69
PROTHROMBIN TIME: 19.7 s — AB (ref 11.4–15.2)

## 2017-09-01 LAB — BASIC METABOLIC PANEL
Anion gap: 9 (ref 5–15)
BUN: 121 mg/dL — AB (ref 6–20)
CHLORIDE: 111 mmol/L (ref 101–111)
CO2: 22 mmol/L (ref 22–32)
Calcium: 8.8 mg/dL — ABNORMAL LOW (ref 8.9–10.3)
Creatinine, Ser: 2.81 mg/dL — ABNORMAL HIGH (ref 0.44–1.00)
GFR calc Af Amer: 18 mL/min — ABNORMAL LOW (ref >60)
GFR calc non Af Amer: 16 mL/min — ABNORMAL LOW (ref >60)
GLUCOSE: 152 mg/dL — AB (ref 65–99)
POTASSIUM: 4.7 mmol/L (ref 3.5–5.1)
Sodium: 142 mmol/L (ref 135–145)

## 2017-09-01 LAB — PROTIME-INR
INR: 1.91
Prothrombin Time: 21.7 seconds — ABNORMAL HIGH (ref 11.4–15.2)

## 2017-09-01 NOTE — Progress Notes (Signed)
[facility]  ROUNDING NOTE   Subjective:  The patient still has significant azotemia however her creatinine is down to 2.8 which is close to her baseline. Patient awake and alert.    Objective:  Vital signs in last 24 hours:  Temperature 90.7 pulse 86 respirations 31 blood pressure 132/90  Physical Exam: General: No acute distress  Head: Normocephalic, atraumatic. Moist oral mucosal membranes  Eyes: Anicteric  Neck: Tracheostomy in place   Lungs:  Clear to auscultation, normal effort  Heart: S1S2 no rubs  Abdomen:  Soft, nontender, bowel sounds present  Extremities: Trace peripheral edema.  Neurologic: Awake, alert, following commands  Skin: No lesions       Basic Metabolic Panel:  Recent Labs Lab 08/26/17 0755 08/28/17 0502 08/29/17 0601 08/30/17 0518 09/01/17 0452  NA 141 144 146* 144 142  K 5.0 4.9 5.0 4.9 4.7  CL 109 111 114* 111 111  CO2 20* '26 24 25 22  ' GLUCOSE 128* 134* 155* 142* 152*  BUN 112* 118* 118* 124* 121*  CREATININE 3.84* 3.28* 3.06* 3.09* 2.81*  CALCIUM 9.0 9.2 9.2 9.2 8.8*    Liver Function Tests: No results for input(s): AST, ALT, ALKPHOS, BILITOT, PROT, ALBUMIN in the last 168 hours. No results for input(s): LIPASE, AMYLASE in the last 168 hours. No results for input(s): AMMONIA in the last 168 hours.  CBC:  Recent Labs Lab 08/28/17 0502  WBC 9.5  HGB 8.8*  HCT 28.7*  MCV 84.4  PLT 188    Cardiac Enzymes: No results for input(s): CKTOTAL, CKMB, CKMBINDEX, TROPONINI in the last 168 hours.  BNP: Invalid input(s): POCBNP  CBG: No results for input(s): GLUCAP in the last 168 hours.  Microbiology: Results for orders placed or performed during the hospital encounter of 07/28/17  Culture, Urine     Status: Abnormal   Collection Time: 08/26/17  5:06 PM  Result Value Ref Range Status   Specimen Description URINE, RANDOM  Final   Special Requests NONE  Final   Culture >=100,000 COLONIES/mL ESCHERICHIA COLI (A)   Final   Report Status 08/28/2017 FINAL  Final   Organism ID, Bacteria ESCHERICHIA COLI (A)  Final      Susceptibility   Escherichia coli - MIC*    AMPICILLIN >=32 RESISTANT Resistant     CEFAZOLIN <=4 SENSITIVE Sensitive     CEFTRIAXONE <=1 SENSITIVE Sensitive     CIPROFLOXACIN >=4 RESISTANT Resistant     GENTAMICIN <=1 SENSITIVE Sensitive     IMIPENEM <=0.25 SENSITIVE Sensitive     NITROFURANTOIN <=16 SENSITIVE Sensitive     TRIMETH/SULFA >=320 RESISTANT Resistant     AMPICILLIN/SULBACTAM >=32 RESISTANT Resistant     PIP/TAZO 8 SENSITIVE Sensitive     Extended ESBL NEGATIVE Sensitive     * >=100,000 COLONIES/mL ESCHERICHIA COLI    Coagulation Studies:  Recent Labs  08/30/17 0518 08/31/17 0517 09/01/17 0452  LABPROT 18.4* 19.7* 21.7*  INR 1.54 1.69 1.91    Urinalysis: No results for input(s): COLORURINE, LABSPEC, PHURINE, GLUCOSEU, HGBUR, BILIRUBINUR, KETONESUR, PROTEINUR, UROBILINOGEN, NITRITE, LEUKOCYTESUR in the last 72 hours.  Invalid input(s): APPERANCEUR    Imaging: No results found.   Medications:       Assessment/ Plan:  76 y.o. 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   Patient was originally admitted for Escherichia coli UTI and falls. She suffered a respiratory and cardiac arrest while at outside hospital. We are asked to see the patient for underlying chronic kidney disease stage IV.  1. Acute renal failure/Chronic kidney disease stage IV. Baseline EGFR is 21. Renal function considerably worse now in terms of BUN. - Kidney function significantly improved with IV  fluid hydration. Creatinine down to 2.8. However patient still has elevated BUN at 121. Continue to monitor for now. No urgent indication for dialysis at the moment.  2. Anemia chronic kidney disease. Hemoglobin 8.8 last check. Continue to monitor.  3. Hypertension. Blood pressure acceptable at 132/90. Continue current antihypertensives.  4. Acute respiratory failure.  Patient had to be restarted on trach collar temporarily. Staff finding a new Passy-Muir valve for the patient.  LOS: 0 Amberlynn Tempesta 9/21/20186:22 PM

## 2017-09-02 LAB — PROTIME-INR
INR: 2.47
Prothrombin Time: 26.6 seconds — ABNORMAL HIGH (ref 11.4–15.2)

## 2017-09-03 ENCOUNTER — Ambulatory Visit (HOSPITAL_COMMUNITY)
Admission: EM | Admit: 2017-09-03 | Discharge: 2017-09-03 | Disposition: A | Discharge status: Home / Self Care | Payer: No Typology Code available for payment source | Source: Ambulatory Visit | Attending: Emergency Medicine | Admitting: Emergency Medicine

## 2017-09-03 DIAGNOSIS — Z0441 Encounter for examination and observation following alleged adult rape: Secondary | ICD-10-CM | POA: Insufficient documentation

## 2017-09-03 LAB — PROTIME-INR
INR: 3.22
Prothrombin Time: 32.7 seconds — ABNORMAL HIGH (ref 11.4–15.2)

## 2017-09-04 LAB — BASIC METABOLIC PANEL
ANION GAP: 7 (ref 5–15)
BUN: 112 mg/dL — ABNORMAL HIGH (ref 6–20)
CALCIUM: 8.8 mg/dL — AB (ref 8.9–10.3)
CO2: 21 mmol/L — ABNORMAL LOW (ref 22–32)
CREATININE: 2.57 mg/dL — AB (ref 0.44–1.00)
Chloride: 112 mmol/L — ABNORMAL HIGH (ref 101–111)
GFR, EST AFRICAN AMERICAN: 20 mL/min — AB (ref >60)
GFR, EST NON AFRICAN AMERICAN: 17 mL/min — AB (ref >60)
Glucose, Bld: 155 mg/dL — ABNORMAL HIGH (ref 65–99)
Potassium: 4.5 mmol/L (ref 3.5–5.1)
SODIUM: 140 mmol/L (ref 135–145)

## 2017-09-04 LAB — PROTIME-INR
INR: 2.88
PROTHROMBIN TIME: 29.9 s — AB (ref 11.4–15.2)

## 2017-09-04 NOTE — Progress Notes (Signed)
[facility]  ROUNDING NOTE   Subjective:  Patient seen and evaluated at bedside. No new renal function testing this a.m. This afternoon the patient is on the ventilator.    Objective:  Vital signs in last 24 hours:  Temperature 97.8 pulse 100 respirations 30 blood pressure 137/78  Physical Exam: General: No acute distress  Head: Normocephalic, atraumatic. Moist oral mucosal membranes  Eyes: Anicteric  Neck: Tracheostomy in place   Lungs:  Clear to auscultation, currently on the ventilator  Heart: S1S2 no rubs  Abdomen:  Soft, nontender, bowel sounds present  Extremities: Trace peripheral edema.  Neurologic: Awake, alert, following commands  Skin: No lesions       Basic Metabolic Panel:  Recent Labs Lab 08/29/17 0601 08/30/17 0518 09/01/17 0452  NA 146* 144 142  K 5.0 4.9 4.7  CL 114* 111 111  CO2 '24 25 22  ' GLUCOSE 155* 142* 152*  BUN 118* 124* 121*  CREATININE 3.06* 3.09* 2.81*  CALCIUM 9.2 9.2 8.8*    Liver Function Tests: No results for input(s): AST, ALT, ALKPHOS, BILITOT, PROT, ALBUMIN in the last 168 hours. No results for input(s): LIPASE, AMYLASE in the last 168 hours. No results for input(s): AMMONIA in the last 168 hours.  CBC: No results for input(s): WBC, NEUTROABS, HGB, HCT, MCV, PLT in the last 168 hours.  Cardiac Enzymes: No results for input(s): CKTOTAL, CKMB, CKMBINDEX, TROPONINI in the last 168 hours.  BNP: Invalid input(s): POCBNP  CBG: No results for input(s): GLUCAP in the last 168 hours.  Microbiology: Results for orders placed or performed during the hospital encounter of 07/28/17  Culture, Urine     Status: Abnormal   Collection Time: 08/26/17  5:06 PM  Result Value Ref Range Status   Specimen Description URINE, RANDOM  Final   Special Requests NONE  Final   Culture >=100,000 COLONIES/mL ESCHERICHIA COLI (A)  Final   Report Status 08/28/2017 FINAL  Final   Organism ID, Bacteria ESCHERICHIA COLI (A)  Final   Susceptibility   Escherichia coli - MIC*    AMPICILLIN >=32 RESISTANT Resistant     CEFAZOLIN <=4 SENSITIVE Sensitive     CEFTRIAXONE <=1 SENSITIVE Sensitive     CIPROFLOXACIN >=4 RESISTANT Resistant     GENTAMICIN <=1 SENSITIVE Sensitive     IMIPENEM <=0.25 SENSITIVE Sensitive     NITROFURANTOIN <=16 SENSITIVE Sensitive     TRIMETH/SULFA >=320 RESISTANT Resistant     AMPICILLIN/SULBACTAM >=32 RESISTANT Resistant     PIP/TAZO 8 SENSITIVE Sensitive     Extended ESBL NEGATIVE Sensitive     * >=100,000 COLONIES/mL ESCHERICHIA COLI    Coagulation Studies:  Recent Labs  09/02/17 0824 09/03/17 0837  LABPROT 26.6* 32.7*  INR 2.47 3.22    Urinalysis: No results for input(s): COLORURINE, LABSPEC, PHURINE, GLUCOSEU, HGBUR, BILIRUBINUR, KETONESUR, PROTEINUR, UROBILINOGEN, NITRITE, LEUKOCYTESUR in the last 72 hours.  Invalid input(s): APPERANCEUR    Imaging: No results found.   Medications:       Assessment/ Plan:  76 y.o. female with a PMHx of Chronic kidney disease stage IV baseline EGFR 21, anemia of chronic kidney disease, morbid obesity, chronic tracheostomy with chronic respiratory failure, chronic atrial fibrillation, hypertension, diabetes mellitus type 2, obstructive sleep apnea, GERD, hypothyroidism who was admitted to [facility] on 07/28/2017 for for treatment of ongoing debility, respiratory failure, chronic kidney disease stage IV.   Patient was originally admitted for Escherichia coli UTI and falls. She suffered a respiratory and cardiac arrest while  at outside hospital. We are asked to see the patient for underlying chronic kidney disease stage IV.  1. Acute renal failure/Chronic kidney disease stage IV. Baseline EGFR is 21. Renal function considerably worse now in terms of BUN. - Creatinine was down to 2.81 as of Friday. Recommend continued periodic renal function monitoring. No indication for dialysis at the moment.  2. Anemia chronic kidney disease.  Hemoglobin 8.8 at last check. Continue to periodically monitor CBC.  3. Hypertension. Blood pressure currently 137/78.  Continue amlodipine, carvedilol, hydralazine.  4. Acute respiratory failure.  Patient currently on the ventilator. Usually she is on the ventilator only at night. Continue to monitor progress.  LOS: 0 Wilmore Holsomback 9/24/20182:59 PM

## 2017-09-05 LAB — PROTIME-INR
INR: 2.48
PROTHROMBIN TIME: 26.6 s — AB (ref 11.4–15.2)
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
## 2017-09-06 LAB — PROTIME-INR
INR: 2.06
Prothrombin Time: 23 seconds — ABNORMAL HIGH (ref 11.4–15.2)

## 2018-05-04 IMAGING — DX DG CHEST 1V PORT
1 series · 1 of 1 positions shown · non-contrast
Comparison: Chest x-ray of August 12, 2017

CLINICAL DATA: Expiratory failure, tracheostomy patient, new
admission.

EXAM:
PORTABLE CHEST 1 VIEW

[chest]
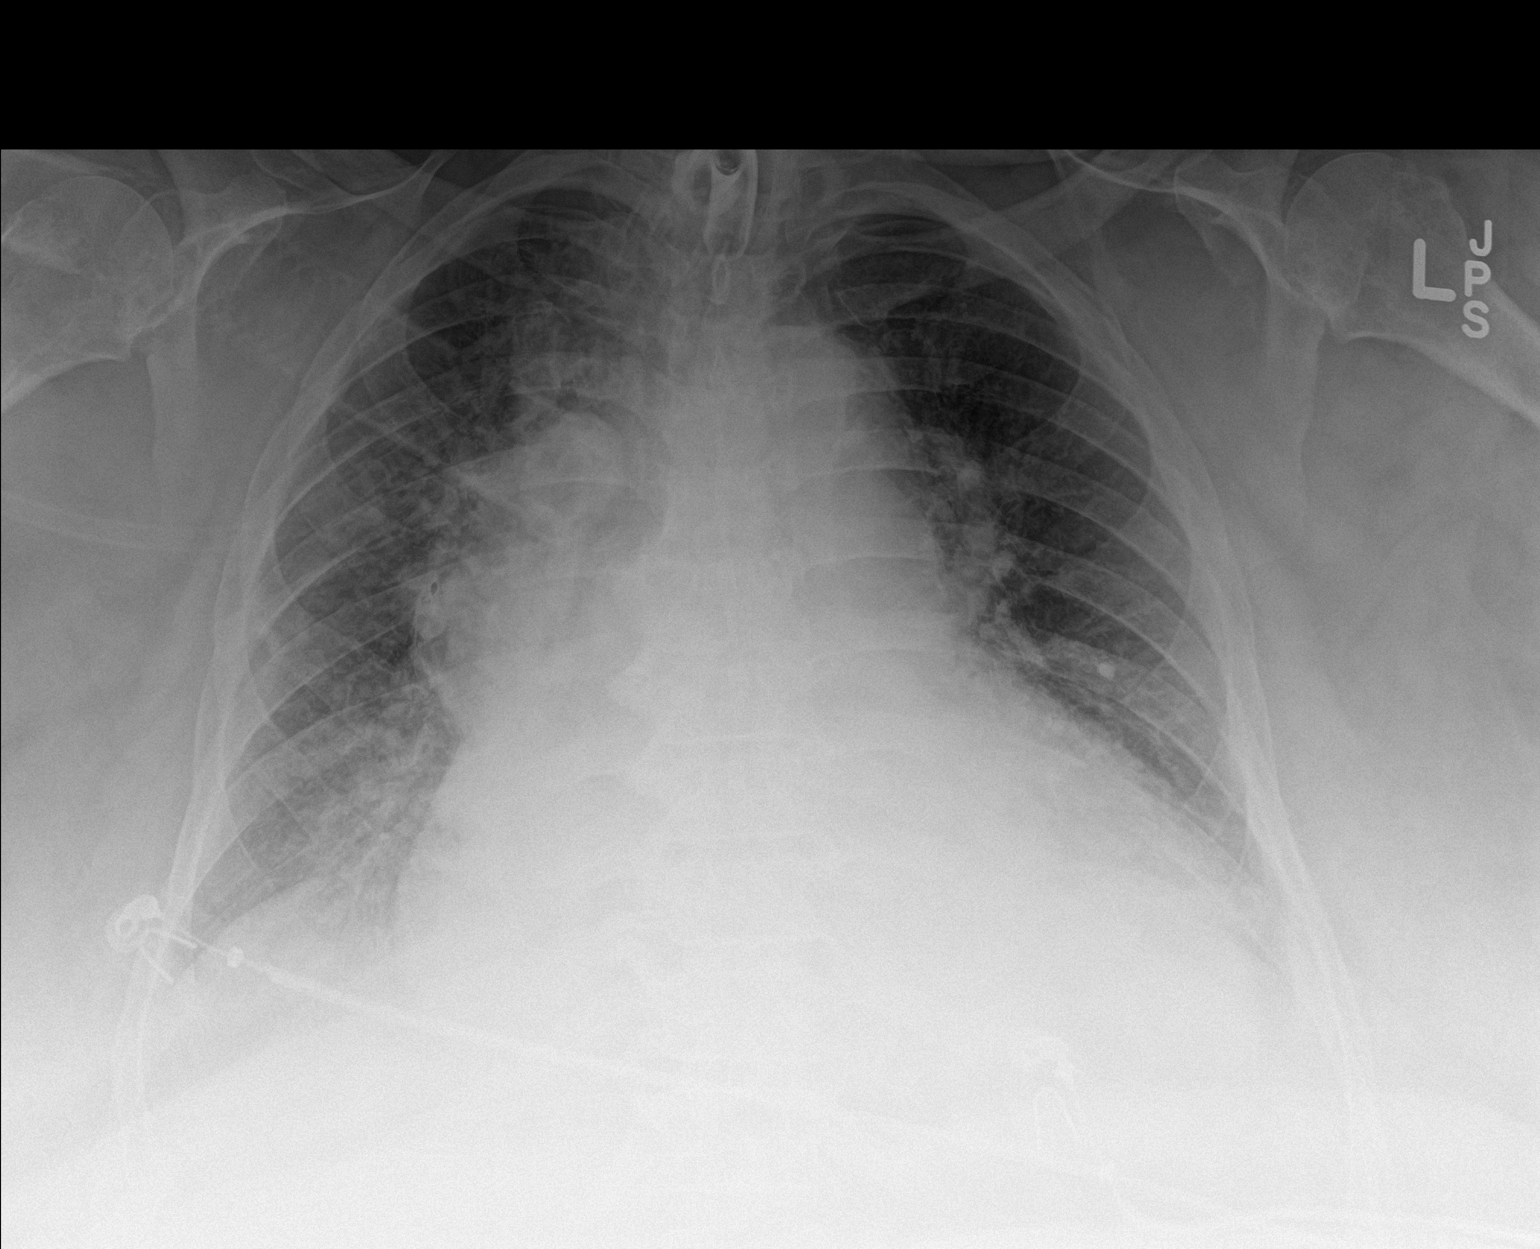

[1 of 1 positions shown; findings below may reference images not displayed]

FINDINGS: The lungs are well-expanded. There is new increased interstitial
density within the right lung and to a lesser extent at the left
lung base. There is no definite pleural effusion. The cardiac
silhouette is enlarged. The pulmonary vascularity is engorged. There
is calcification in the wall of the aortic arch. The observed bony
thorax is unremarkable.
IMPRESSION: Probable CHF with mild interstitial edema, worse since the previous
study. One cannot exclude superimposed interstitial pneumonia in the
appropriate clinical setting.

Thoracic aortic atherosclerosis.

## 2018-05-07 IMAGING — DX DG CHEST 1V PORT
2 series · 2 of 2 positions shown · non-contrast
Comparison: 08/24/2017

CLINICAL DATA: Heart failure.

EXAM:
PORTABLE CHEST 1 VIEW

[chest ap (1 of 2)]
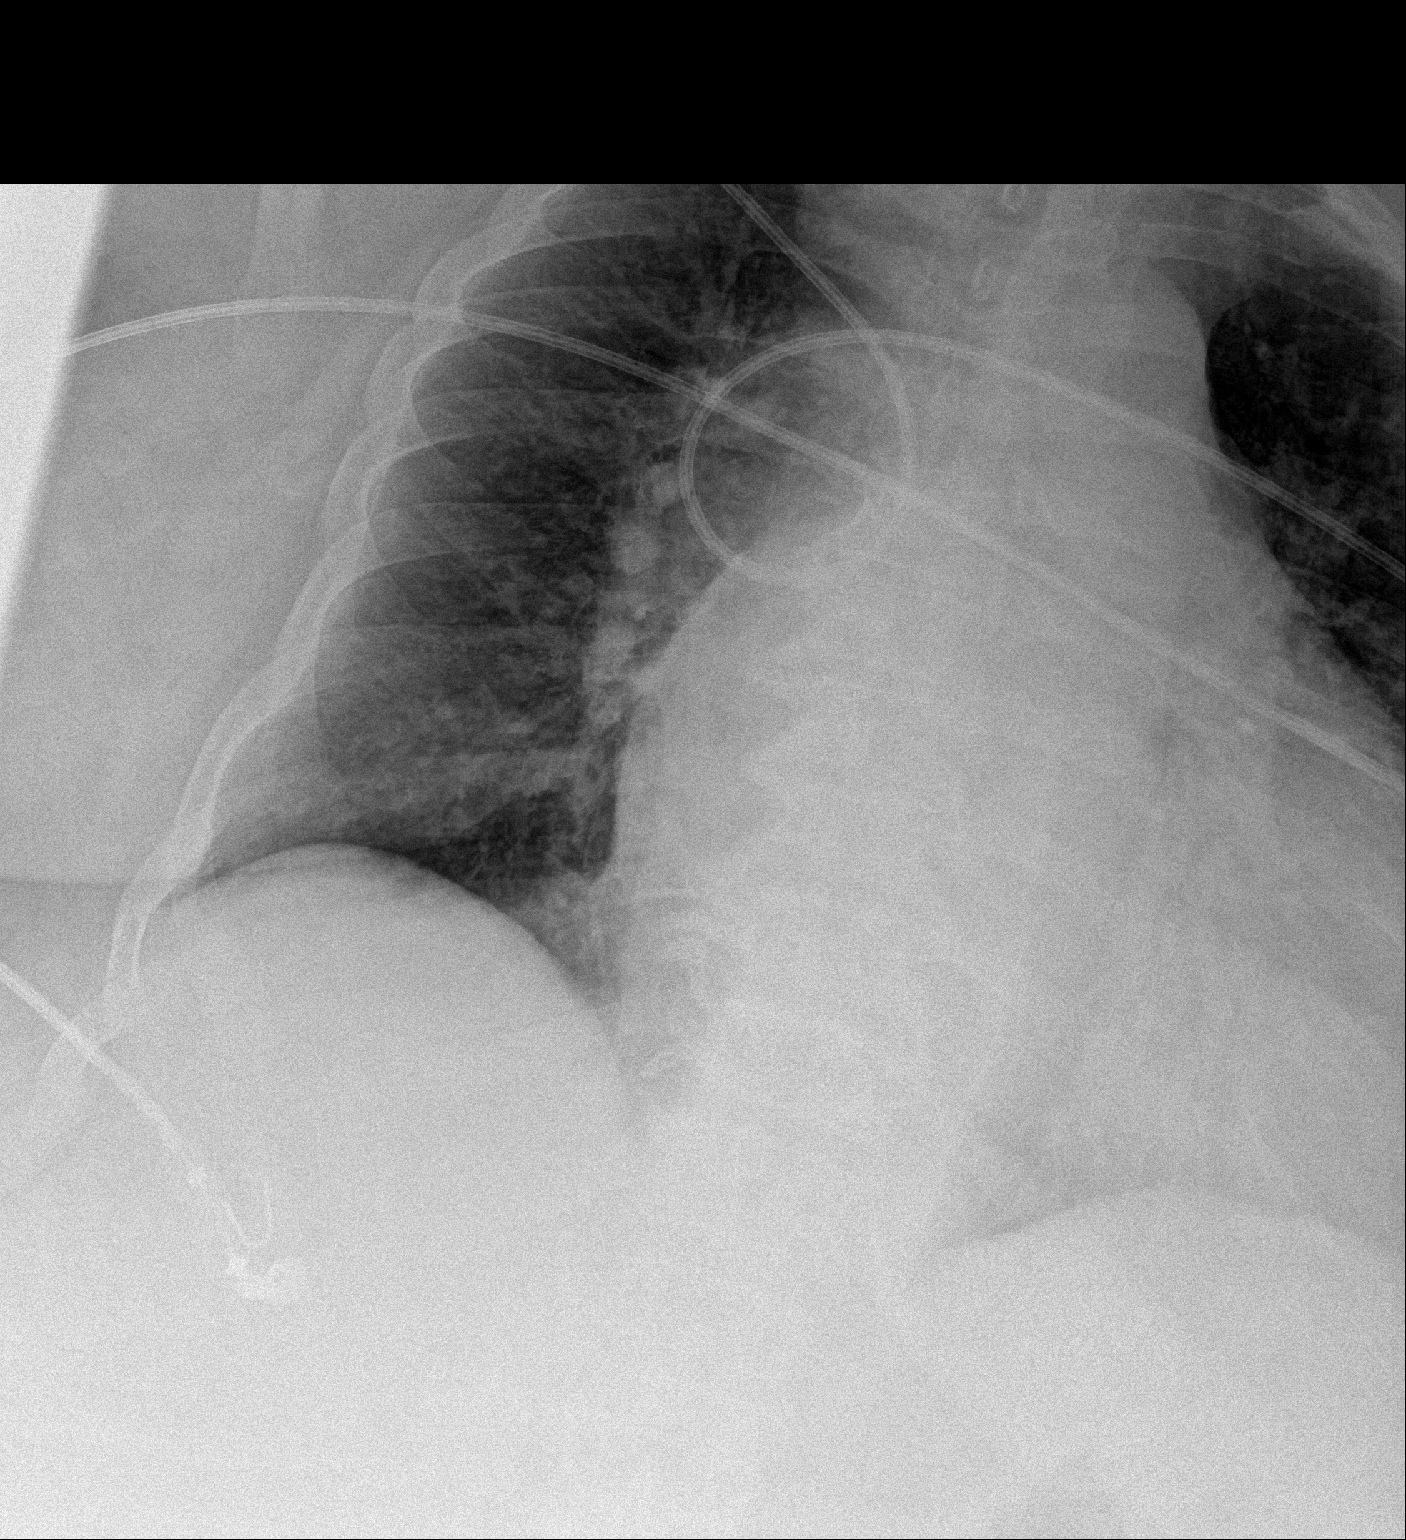

[chest ap (2 of 2)]
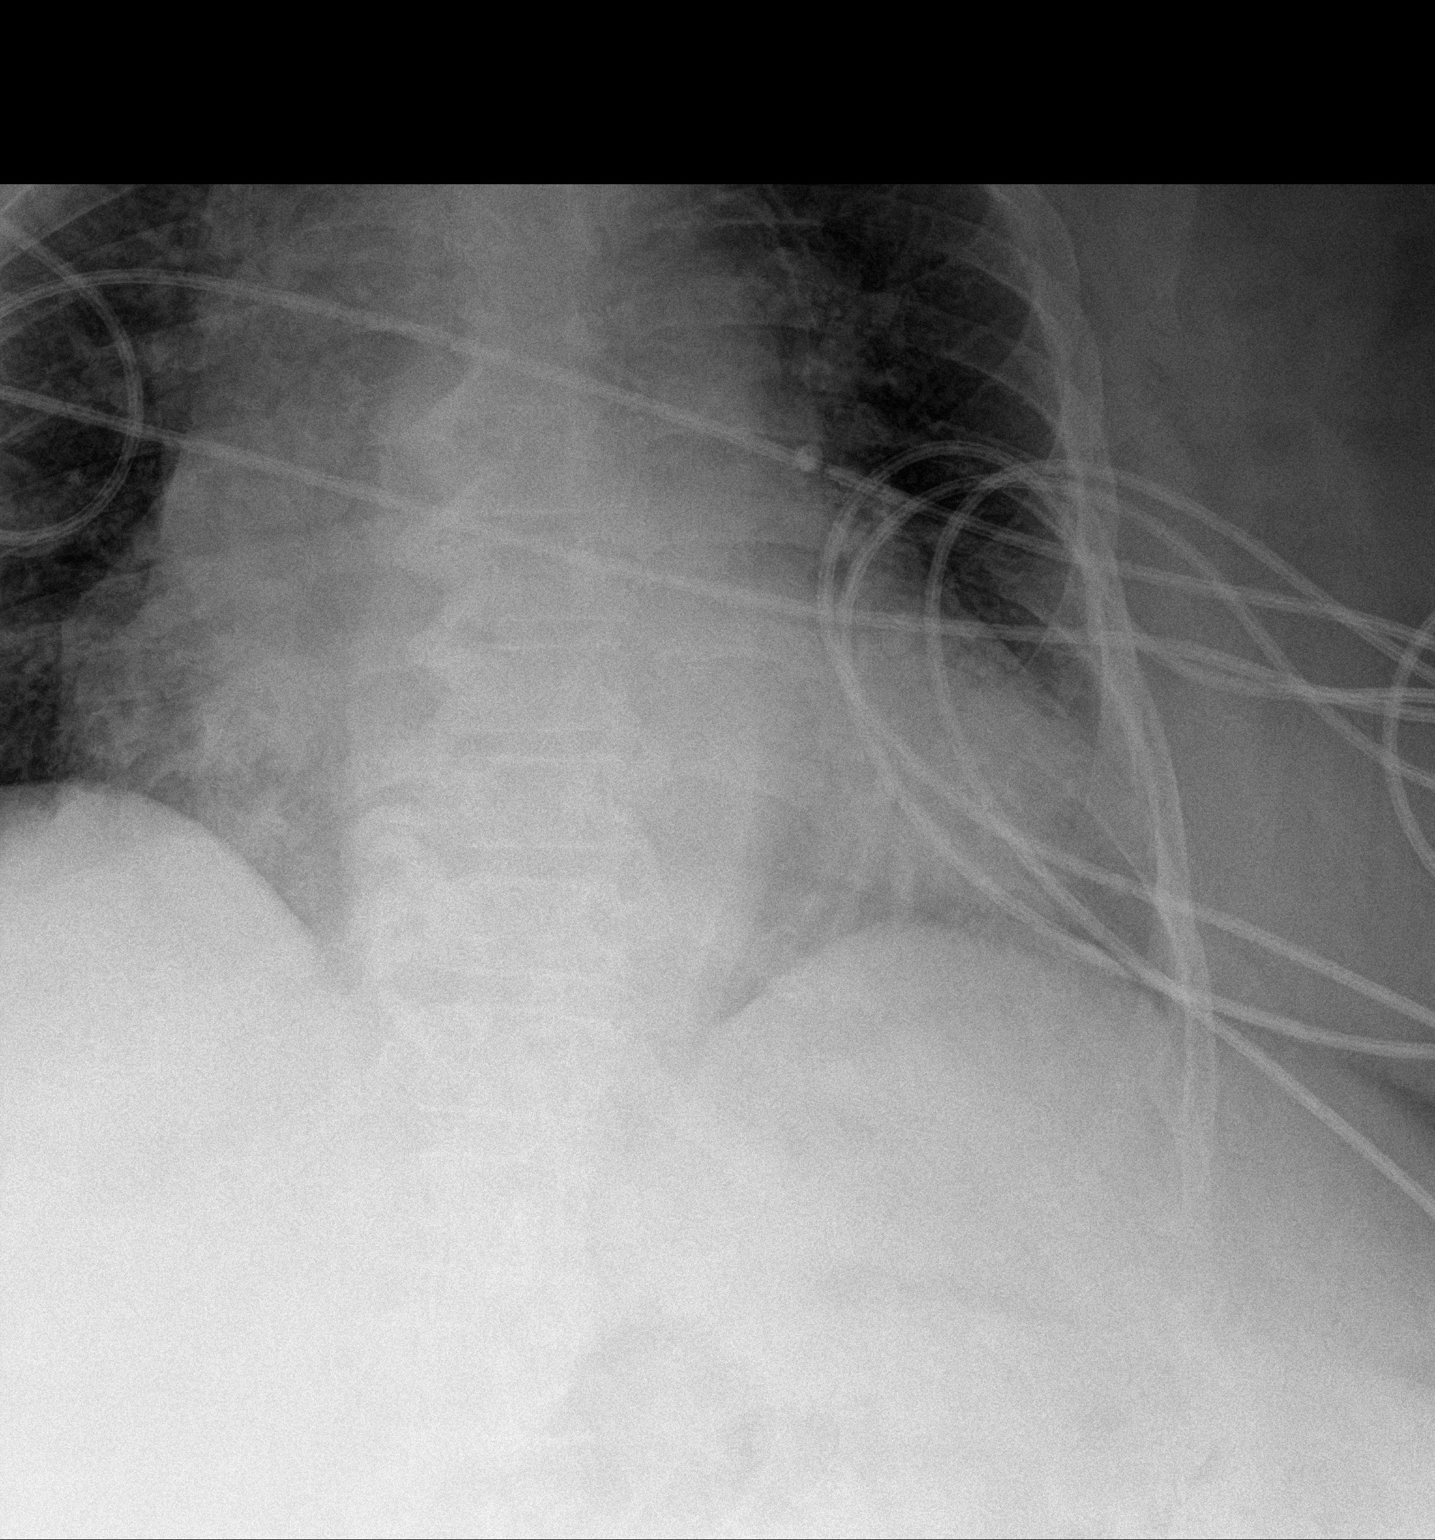

[2 of 2 positions shown; findings below may reference images not displayed]

FINDINGS: Cardiomegaly and tracheostomy tube again noted.

Mild pulmonary vascular congestion is again identified.

Improved aeration in the lung bases noted.

No large pleural effusion or pneumothorax noted.
IMPRESSION: Cardiomegaly with mild pulmonary vascular congestion. Improved
bibasilar aeration.

## 2019-06-01 DIAGNOSIS — Q320 Congenital tracheomalacia: Secondary | ICD-10-CM

## 2019-06-01 DIAGNOSIS — J9621 Acute and chronic respiratory failure with hypoxia: Secondary | ICD-10-CM

## 2019-06-01 DIAGNOSIS — I482 Chronic atrial fibrillation, unspecified: Secondary | ICD-10-CM

## 2019-06-01 DIAGNOSIS — I5032 Chronic diastolic (congestive) heart failure: Secondary | ICD-10-CM

## 2019-06-02 DIAGNOSIS — I5032 Chronic diastolic (congestive) heart failure: Secondary | ICD-10-CM

## 2019-06-02 DIAGNOSIS — J9621 Acute and chronic respiratory failure with hypoxia: Secondary | ICD-10-CM

## 2019-06-02 DIAGNOSIS — Q320 Congenital tracheomalacia: Secondary | ICD-10-CM

## 2019-06-02 DIAGNOSIS — I482 Chronic atrial fibrillation, unspecified: Secondary | ICD-10-CM

## 2019-06-10 DIAGNOSIS — I5032 Chronic diastolic (congestive) heart failure: Secondary | ICD-10-CM

## 2019-06-10 DIAGNOSIS — Q320 Congenital tracheomalacia: Secondary | ICD-10-CM

## 2019-06-10 DIAGNOSIS — I482 Chronic atrial fibrillation, unspecified: Secondary | ICD-10-CM

## 2019-06-10 DIAGNOSIS — J9621 Acute and chronic respiratory failure with hypoxia: Secondary | ICD-10-CM

## 2019-06-11 DIAGNOSIS — I5032 Chronic diastolic (congestive) heart failure: Secondary | ICD-10-CM

## 2019-06-11 DIAGNOSIS — J9621 Acute and chronic respiratory failure with hypoxia: Secondary | ICD-10-CM

## 2019-06-11 DIAGNOSIS — I482 Chronic atrial fibrillation, unspecified: Secondary | ICD-10-CM

## 2019-06-11 DIAGNOSIS — Q320 Congenital tracheomalacia: Secondary | ICD-10-CM

## 2019-06-12 DIAGNOSIS — Q320 Congenital tracheomalacia: Secondary | ICD-10-CM

## 2019-06-12 DIAGNOSIS — I5032 Chronic diastolic (congestive) heart failure: Secondary | ICD-10-CM

## 2019-06-12 DIAGNOSIS — I482 Chronic atrial fibrillation, unspecified: Secondary | ICD-10-CM | POA: Diagnosis not present

## 2019-06-12 DIAGNOSIS — J9621 Acute and chronic respiratory failure with hypoxia: Secondary | ICD-10-CM

## 2019-06-13 DIAGNOSIS — I5032 Chronic diastolic (congestive) heart failure: Secondary | ICD-10-CM | POA: Diagnosis not present

## 2019-06-13 DIAGNOSIS — J9621 Acute and chronic respiratory failure with hypoxia: Secondary | ICD-10-CM | POA: Diagnosis not present

## 2019-06-13 DIAGNOSIS — I482 Chronic atrial fibrillation, unspecified: Secondary | ICD-10-CM | POA: Diagnosis not present

## 2019-06-13 DIAGNOSIS — Q320 Congenital tracheomalacia: Secondary | ICD-10-CM | POA: Diagnosis not present

## 2019-06-14 DIAGNOSIS — I482 Chronic atrial fibrillation, unspecified: Secondary | ICD-10-CM | POA: Diagnosis not present

## 2019-06-14 DIAGNOSIS — I5032 Chronic diastolic (congestive) heart failure: Secondary | ICD-10-CM | POA: Diagnosis not present

## 2019-06-14 DIAGNOSIS — J9621 Acute and chronic respiratory failure with hypoxia: Secondary | ICD-10-CM | POA: Diagnosis not present

## 2019-06-14 DIAGNOSIS — Q320 Congenital tracheomalacia: Secondary | ICD-10-CM | POA: Diagnosis not present

## 2019-06-15 DIAGNOSIS — I482 Chronic atrial fibrillation, unspecified: Secondary | ICD-10-CM

## 2019-06-15 DIAGNOSIS — J9621 Acute and chronic respiratory failure with hypoxia: Secondary | ICD-10-CM

## 2019-06-15 DIAGNOSIS — Q320 Congenital tracheomalacia: Secondary | ICD-10-CM

## 2019-06-15 DIAGNOSIS — I5032 Chronic diastolic (congestive) heart failure: Secondary | ICD-10-CM

## 2019-06-16 DIAGNOSIS — I482 Chronic atrial fibrillation, unspecified: Secondary | ICD-10-CM

## 2019-06-16 DIAGNOSIS — I5032 Chronic diastolic (congestive) heart failure: Secondary | ICD-10-CM | POA: Diagnosis not present

## 2019-06-16 DIAGNOSIS — Q320 Congenital tracheomalacia: Secondary | ICD-10-CM | POA: Diagnosis not present

## 2019-06-16 DIAGNOSIS — J9621 Acute and chronic respiratory failure with hypoxia: Secondary | ICD-10-CM | POA: Diagnosis not present

## 2019-06-17 DIAGNOSIS — I482 Chronic atrial fibrillation, unspecified: Secondary | ICD-10-CM | POA: Diagnosis not present

## 2019-06-17 DIAGNOSIS — J9621 Acute and chronic respiratory failure with hypoxia: Secondary | ICD-10-CM | POA: Diagnosis not present

## 2019-06-17 DIAGNOSIS — I5032 Chronic diastolic (congestive) heart failure: Secondary | ICD-10-CM | POA: Diagnosis not present

## 2019-06-17 DIAGNOSIS — Q320 Congenital tracheomalacia: Secondary | ICD-10-CM | POA: Diagnosis not present

## 2019-06-18 DIAGNOSIS — I5032 Chronic diastolic (congestive) heart failure: Secondary | ICD-10-CM | POA: Diagnosis not present

## 2019-06-18 DIAGNOSIS — J9621 Acute and chronic respiratory failure with hypoxia: Secondary | ICD-10-CM

## 2019-06-18 DIAGNOSIS — I482 Chronic atrial fibrillation, unspecified: Secondary | ICD-10-CM | POA: Diagnosis not present

## 2019-06-18 DIAGNOSIS — Q320 Congenital tracheomalacia: Secondary | ICD-10-CM | POA: Diagnosis not present

## 2019-06-19 DIAGNOSIS — I5032 Chronic diastolic (congestive) heart failure: Secondary | ICD-10-CM | POA: Diagnosis not present

## 2019-06-19 DIAGNOSIS — Q320 Congenital tracheomalacia: Secondary | ICD-10-CM | POA: Diagnosis not present

## 2019-06-19 DIAGNOSIS — J9621 Acute and chronic respiratory failure with hypoxia: Secondary | ICD-10-CM | POA: Diagnosis not present

## 2019-06-19 DIAGNOSIS — I482 Chronic atrial fibrillation, unspecified: Secondary | ICD-10-CM | POA: Diagnosis not present

## 2019-06-20 DIAGNOSIS — Q320 Congenital tracheomalacia: Secondary | ICD-10-CM

## 2019-06-20 DIAGNOSIS — I5032 Chronic diastolic (congestive) heart failure: Secondary | ICD-10-CM

## 2019-06-20 DIAGNOSIS — J9621 Acute and chronic respiratory failure with hypoxia: Secondary | ICD-10-CM

## 2019-06-20 DIAGNOSIS — I482 Chronic atrial fibrillation, unspecified: Secondary | ICD-10-CM | POA: Diagnosis not present

## 2019-06-21 DIAGNOSIS — I5032 Chronic diastolic (congestive) heart failure: Secondary | ICD-10-CM

## 2019-06-21 DIAGNOSIS — J9621 Acute and chronic respiratory failure with hypoxia: Secondary | ICD-10-CM

## 2019-06-21 DIAGNOSIS — Q320 Congenital tracheomalacia: Secondary | ICD-10-CM | POA: Diagnosis not present

## 2019-06-21 DIAGNOSIS — I482 Chronic atrial fibrillation, unspecified: Secondary | ICD-10-CM

## 2019-06-22 DIAGNOSIS — I482 Chronic atrial fibrillation, unspecified: Secondary | ICD-10-CM | POA: Diagnosis not present

## 2019-06-22 DIAGNOSIS — Q320 Congenital tracheomalacia: Secondary | ICD-10-CM | POA: Diagnosis not present

## 2019-06-22 DIAGNOSIS — J9621 Acute and chronic respiratory failure with hypoxia: Secondary | ICD-10-CM | POA: Diagnosis not present

## 2019-06-22 DIAGNOSIS — I5032 Chronic diastolic (congestive) heart failure: Secondary | ICD-10-CM | POA: Diagnosis not present

## 2019-06-23 DIAGNOSIS — I5032 Chronic diastolic (congestive) heart failure: Secondary | ICD-10-CM | POA: Diagnosis not present

## 2019-06-23 DIAGNOSIS — Q320 Congenital tracheomalacia: Secondary | ICD-10-CM | POA: Diagnosis not present

## 2019-06-23 DIAGNOSIS — J9621 Acute and chronic respiratory failure with hypoxia: Secondary | ICD-10-CM | POA: Diagnosis not present

## 2019-06-23 DIAGNOSIS — I482 Chronic atrial fibrillation, unspecified: Secondary | ICD-10-CM | POA: Diagnosis not present

## 2019-06-24 DIAGNOSIS — Q320 Congenital tracheomalacia: Secondary | ICD-10-CM | POA: Diagnosis not present

## 2019-06-24 DIAGNOSIS — I5032 Chronic diastolic (congestive) heart failure: Secondary | ICD-10-CM

## 2019-06-24 DIAGNOSIS — J9621 Acute and chronic respiratory failure with hypoxia: Secondary | ICD-10-CM

## 2019-06-24 DIAGNOSIS — I482 Chronic atrial fibrillation, unspecified: Secondary | ICD-10-CM | POA: Diagnosis not present

## 2019-07-03 ENCOUNTER — Other Ambulatory Visit: Payer: Self-pay

## 2019-07-03 ENCOUNTER — Emergency Department (HOSPITAL_COMMUNITY)
Admission: EM | Admit: 2019-07-03 | Discharge: 2019-07-04 | Disposition: A | Discharge status: Home / Self Care | Payer: Medicare Other | Attending: Emergency Medicine | Admitting: Emergency Medicine

## 2019-07-03 DIAGNOSIS — Z4659 Encounter for fitting and adjustment of other gastrointestinal appliance and device: Secondary | ICD-10-CM | POA: Diagnosis not present

## 2019-07-03 DIAGNOSIS — R1310 Dysphagia, unspecified: Secondary | ICD-10-CM | POA: Diagnosis not present

## 2019-07-03 MED ORDER — LIDOCAINE VISCOUS HCL 2 % MT SOLN
15.0000 mL | Freq: Once | OROMUCOSAL | Status: DC
  Filled 2019-07-03: qty 15

## 2019-07-03 MED ORDER — OXYMETAZOLINE HCL 0.05 % NA SOLN
1.0000 | Freq: Once | NASAL | Status: AC
  Administered 2019-07-04: 1 via NASAL
  Filled 2019-07-03: qty 30

## 2019-07-03 MED ORDER — BENZOCAINE 20 % MT AERO
INHALATION_SPRAY | Freq: Once | OROMUCOSAL | Status: AC
  Administered 2019-07-04: 02:00:00 via OROMUCOSAL
  Filled 2019-07-03: qty 57

## 2019-07-03 NOTE — ED Provider Notes (Signed)
Pt from [facility] is to place NG Tube then discharge back to facility   Ripley Fraise, MD 07/03/19 2336

## 2019-07-03 NOTE — ED Triage Notes (Signed)
Pt BIB [facility] from [facility]. Questionable aspiration, failed swallow screen/study today, thinks she has an esophageal blockage. Needs NG or Peg tube for feeding. Pt is Diabetic-CBG PTA per [facility] is 13

## 2019-07-03 NOTE — ED Provider Notes (Signed)
[facility] EMERGENCY DEPARTMENT Provider Note   CSN: [id number] Arrival date & time: 07/03/19  2123     History   Chief Complaint No chief complaint on file.   HPI Shirley Malone is a 78 y.o. female.     Patient is a 78 year old female with a history of respiratory failure with trach placement and chronically ventilated who lives at [facility] and had an episode of aspiration last night during the night and had findings concerning for aspiration pneumonia.  Today she had a swallow study that she failed and she was sent to the emergency room tonight to have an NG tube placed for nutrition.  They attempted to place an NG at the facility and were unable to.  They denied any change in patient's respiratory status, fever or other changes.  She is awake and alert and coughing upon arrival stating she needs suction.  EMS reported they did remove mucous plug was suctioned when they arrived but she had otherwise been on her typical vent settings.  The history is provided by medical records and the EMS personnel.    No past medical history on file.  There are no active problems to display for this patient.      OB History   No obstetric history on file.      Home Medications    Prior to Admission medications   Not on File    Family History No family history on file.  Social History Social History   Tobacco Use  . Smoking status: Not on file  Substance Use Topics  . Alcohol use: Not on file  . Drug use: Not on file     Allergies   Patient has no allergy information on record.   Review of Systems Review of Systems  Unable to perform ROS: Patient nonverbal     Physical Exam Updated Vital Signs BP (!) 143/99   Pulse 79   Temp 98.3 F (36.8 C) (Oral)   Resp (!) 22   SpO2 100%   Physical Exam Vitals signs and nursing note reviewed.  Constitutional:      General: She is not in acute distress.    Appearance: She is well-developed. She  is obese.  HENT:     Head: Normocephalic and atraumatic.  Eyes:     Conjunctiva/sclera: Conjunctivae normal.     Pupils: Pupils are equal, round, and reactive to light.  Neck:     Musculoskeletal: Normal range of motion and neck supple.     Comments: Trach in place without bleeding Cardiovascular:     Rate and Rhythm: Normal rate and regular rhythm.     Heart sounds: No murmur.  Pulmonary:     Effort: Pulmonary effort is normal. Tachypnea present. No respiratory distress.     Breath sounds: Rhonchi present. No wheezing or rales.     Comments: Coarse breath sounds and rhonchi throughout Abdominal:     General: There is no distension.     Palpations: Abdomen is soft.     Tenderness: There is no abdominal tenderness. There is no guarding or rebound.  Musculoskeletal: Normal range of motion.        General: No tenderness.  Skin:    General: Skin is warm and dry.     Findings: No erythema or rash.  Neurological:     Mental Status: She is alert and oriented to person, place, and time.  Psychiatric:        Behavior: Behavior normal.  ED Treatments / Results  Labs (all labs ordered are listed, but only abnormal results are displayed) Labs Reviewed - No data to display  EKG None  Radiology No results found.  Procedures Procedures (including critical care time)  Medications Ordered in ED Medications  lidocaine (XYLOCAINE) 2 % viscous mouth solution 15 mL (has no administration in time range)  oxymetazoline (AFRIN) 0.05 % nasal spray 1 spray (has no administration in time range)  Benzocaine (HURRCAINE) 20 % mouth spray (has no administration in time range)     Initial Impression / Assessment and Plan / ED Course  I have reviewed the triage vital signs and the nursing notes.  Pertinent labs & imaging results that were available during my care of the patient were reviewed by me and considered in my medical decision making (see chart for details).       Patient  presenting today from [facility] due to history of aspiration pneumonia and failed swallow study.  They were unable to pass an NG tube and sent patient here for NG tube placement Final Clinical Impressions(s) / ED Diagnoses   Final diagnoses:  None    ED Discharge Orders    None       Blanchie Dessert, MD 07/03/19 [phone]

## 2019-07-03 NOTE — Progress Notes (Signed)
Received patient via [facility]. Coming from [facility]. Placed on mechanical ventilation per patients settings at previous facility.   VT 450 Rate 16 Fio2 28% Peep +14 (per [facility] , for chronic trach)

## 2019-07-04 DIAGNOSIS — R1310 Dysphagia, unspecified: Secondary | ICD-10-CM | POA: Diagnosis not present

## 2019-07-04 LAB — POCT I-STAT 7, (LYTES, BLD GAS, ICA,H+H)
Acid-base deficit: 6 mmol/L — ABNORMAL HIGH (ref 0.0–2.0)
Bicarbonate: 21.3 mmol/L (ref 20.0–28.0)
Calcium, Ion: 1.36 mmol/L (ref 1.15–1.40)
HCT: 37 % (ref 36.0–46.0)
Hemoglobin: 12.6 g/dL (ref 12.0–15.0)
O2 Saturation: 96 %
Patient temperature: 98.3
Potassium: 5.2 mmol/L — ABNORMAL HIGH (ref 3.5–5.1)
Sodium: 139 mmol/L (ref 135–145)
TCO2: 23 mmol/L (ref 22–32)
pCO2 arterial: 45.9 mmHg (ref 32.0–48.0)
pH, Arterial: 7.275 — ABNORMAL LOW (ref 7.350–7.450)
pO2, Arterial: 92 mmHg (ref 83.0–108.0)

## 2019-07-04 MED ORDER — HYDRALAZINE HCL 20 MG/ML IJ SOLN
10.0000 mg | Freq: Once | INTRAMUSCULAR | Status: AC
  Administered 2019-07-04: 10 mg via INTRAVENOUS
  Filled 2019-07-04: qty 1

## 2019-07-04 NOTE — ED Provider Notes (Signed)
We initially considered swallow screen but due to patient having trach, she does not qualify.  Patient continues to refuse NG tube placement.  She is awake alert in no acute distress.  She denies any pain complaints.  She claimsthat she is able to take food while she has been at [facility].  At this point she is hemodynamically appropriate, will be discharged back to facility.  I did warn her that it may be difficult to give her nutrition, but this can be assessed later in the morning at [facility] Began, Elenore Rota, MD 07/04/19 [phone]

## 2019-07-04 NOTE — ED Notes (Signed)
Report given to [facility].

## 2019-07-04 NOTE — ED Notes (Signed)
Patient refused RN to insert NGT.

## 2019-07-04 NOTE — ED Notes (Signed)
Pt refused NG tube placement

## 2019-07-04 NOTE — Progress Notes (Signed)
RT Note:  Attempted ATC due to patient not tolerating vent. Coughing, gagging.  Once on ATC suctioned moderate, thick yellow secretions.  Patient complains of SOB,desaturation to 87%. Placed back on mechanical ventilation.

## 2019-07-04 NOTE — ED Notes (Signed)
Pt refused NG tube placement x 2

## 2019-07-04 NOTE — ED Notes (Signed)
Report given to Otila Kluver RN at [facility] , [place] notified by Network engineer to transport .

## 2019-07-04 NOTE — Discharge Instructions (Addendum)
Patient has refused any NG tube placement.  She reports she is able to take foods and fluid. She denies any complaints and prefers discharge back to [facility]

## 2019-07-09 LAB — CBG MONITORING, ED: Glucose-Capillary: 79 mg/dL (ref 70–99)

## 2019-07-30 ENCOUNTER — Observation Stay (HOSPITAL_COMMUNITY): Payer: Medicare Other

## 2019-07-30 ENCOUNTER — Other Ambulatory Visit: Payer: Self-pay

## 2019-07-30 ENCOUNTER — Inpatient Hospital Stay (HOSPITAL_COMMUNITY)
Admission: EM | Admit: 2019-07-30 | Discharge: 2019-08-01 | DRG: 378 | Disposition: A | Discharge status: Other Acute Inpatient Hospital | Payer: Medicare Other | Source: Other Acute Inpatient Hospital | Attending: Internal Medicine | Admitting: Internal Medicine

## 2019-07-30 ENCOUNTER — Encounter (HOSPITAL_COMMUNITY): Payer: Self-pay

## 2019-07-30 DIAGNOSIS — N179 Acute kidney failure, unspecified: Secondary | ICD-10-CM | POA: Diagnosis not present

## 2019-07-30 DIAGNOSIS — D62 Acute posthemorrhagic anemia: Secondary | ICD-10-CM | POA: Diagnosis present

## 2019-07-30 DIAGNOSIS — I482 Chronic atrial fibrillation, unspecified: Secondary | ICD-10-CM | POA: Diagnosis not present

## 2019-07-30 DIAGNOSIS — Z881 Allergy status to other antibiotic agents status: Secondary | ICD-10-CM | POA: Diagnosis not present

## 2019-07-30 DIAGNOSIS — Z86718 Personal history of other venous thrombosis and embolism: Secondary | ICD-10-CM

## 2019-07-30 DIAGNOSIS — D649 Anemia, unspecified: Secondary | ICD-10-CM

## 2019-07-30 DIAGNOSIS — Z20828 Contact with and (suspected) exposure to other viral communicable diseases: Secondary | ICD-10-CM | POA: Diagnosis present

## 2019-07-30 DIAGNOSIS — E039 Hypothyroidism, unspecified: Secondary | ICD-10-CM | POA: Diagnosis present

## 2019-07-30 DIAGNOSIS — Z888 Allergy status to other drugs, medicaments and biological substances status: Secondary | ICD-10-CM

## 2019-07-30 DIAGNOSIS — J302 Other seasonal allergic rhinitis: Secondary | ICD-10-CM | POA: Diagnosis present

## 2019-07-30 DIAGNOSIS — E1122 Type 2 diabetes mellitus with diabetic chronic kidney disease: Secondary | ICD-10-CM | POA: Diagnosis not present

## 2019-07-30 DIAGNOSIS — Z885 Allergy status to narcotic agent status: Secondary | ICD-10-CM | POA: Diagnosis not present

## 2019-07-30 DIAGNOSIS — I5032 Chronic diastolic (congestive) heart failure: Secondary | ICD-10-CM | POA: Diagnosis present

## 2019-07-30 DIAGNOSIS — Z6841 Body Mass Index (BMI) 40.0 and over, adult: Secondary | ICD-10-CM | POA: Diagnosis not present

## 2019-07-30 DIAGNOSIS — K921 Melena: Principal | ICD-10-CM | POA: Diagnosis present

## 2019-07-30 DIAGNOSIS — E46 Unspecified protein-calorie malnutrition: Secondary | ICD-10-CM | POA: Diagnosis present

## 2019-07-30 DIAGNOSIS — J9610 Chronic respiratory failure, unspecified whether with hypoxia or hypercapnia: Secondary | ICD-10-CM | POA: Diagnosis not present

## 2019-07-30 DIAGNOSIS — Z79890 Hormone replacement therapy (postmenopausal): Secondary | ICD-10-CM

## 2019-07-30 DIAGNOSIS — I1310 Hypertensive heart and chronic kidney disease without heart failure, with stage 1 through stage 4 chronic kidney disease, or unspecified chronic kidney disease: Secondary | ICD-10-CM | POA: Diagnosis not present

## 2019-07-30 DIAGNOSIS — R1310 Dysphagia, unspecified: Secondary | ICD-10-CM | POA: Diagnosis present

## 2019-07-30 DIAGNOSIS — Z9911 Dependence on respirator [ventilator] status: Secondary | ICD-10-CM | POA: Diagnosis not present

## 2019-07-30 DIAGNOSIS — Z931 Gastrostomy status: Secondary | ICD-10-CM

## 2019-07-30 DIAGNOSIS — Z930 Tracheostomy status: Secondary | ICD-10-CM

## 2019-07-30 DIAGNOSIS — N184 Chronic kidney disease, stage 4 (severe): Secondary | ICD-10-CM | POA: Diagnosis present

## 2019-07-30 DIAGNOSIS — Z7901 Long term (current) use of anticoagulants: Secondary | ICD-10-CM

## 2019-07-30 DIAGNOSIS — E785 Hyperlipidemia, unspecified: Secondary | ICD-10-CM | POA: Diagnosis not present

## 2019-07-30 DIAGNOSIS — K922 Gastrointestinal hemorrhage, unspecified: Secondary | ICD-10-CM | POA: Diagnosis present

## 2019-07-30 DIAGNOSIS — D638 Anemia in other chronic diseases classified elsewhere: Secondary | ICD-10-CM | POA: Diagnosis not present

## 2019-07-30 DIAGNOSIS — Z79899 Other long term (current) drug therapy: Secondary | ICD-10-CM | POA: Diagnosis not present

## 2019-07-30 DIAGNOSIS — Z91040 Latex allergy status: Secondary | ICD-10-CM

## 2019-07-30 DIAGNOSIS — J9690 Respiratory failure, unspecified, unspecified whether with hypoxia or hypercapnia: Secondary | ICD-10-CM

## 2019-07-30 HISTORY — DX: Acute embolism and thrombosis of unspecified deep veins of unspecified lower extremity: I82.409

## 2019-07-30 HISTORY — DX: Disorder of thyroid, unspecified: E07.9

## 2019-07-30 HISTORY — DX: Unspecified atrial fibrillation: I48.91

## 2019-07-30 HISTORY — DX: Type 2 diabetes mellitus without complications: E11.9

## 2019-07-30 HISTORY — DX: Gastrointestinal hemorrhage, unspecified: K92.2

## 2019-07-30 HISTORY — DX: Anemia, unspecified: D64.9

## 2019-07-30 HISTORY — DX: Chronic kidney disease, unspecified: N18.9

## 2019-07-30 HISTORY — DX: Morbid (severe) obesity due to excess calories: E66.01

## 2019-07-30 HISTORY — DX: Chronic respiratory failure, unspecified whether with hypoxia or hypercapnia: J96.10

## 2019-07-30 HISTORY — DX: Morbid (severe) obesity with alveolar hypoventilation: E66.2

## 2019-07-30 LAB — BLOOD GAS, ARTERIAL
Acid-Base Excess: 4.8 mmol/L — ABNORMAL HIGH (ref 0.0–2.0)
Bicarbonate: 28.9 mmol/L — ABNORMAL HIGH (ref 20.0–28.0)
Drawn by: 418751
FIO2: 30
MECHVT: 500 mL
O2 Saturation: 98.7 %
PEEP: 5 cmH2O
Patient temperature: 98.6
RATE: 16 resp/min
pCO2 arterial: 44.1 mmHg (ref 32.0–48.0)
pH, Arterial: 7.432 (ref 7.350–7.450)
pO2, Arterial: 125 mmHg — ABNORMAL HIGH (ref 83.0–108.0)

## 2019-07-30 LAB — CBC
HCT: 20.9 % — ABNORMAL LOW (ref 36.0–46.0)
Hemoglobin: 6.3 g/dL — CL (ref 12.0–15.0)
MCH: 26.1 pg (ref 26.0–34.0)
MCHC: 30.1 g/dL (ref 30.0–36.0)
MCV: 86.7 fL (ref 80.0–100.0)
Platelets: 237 10*3/uL (ref 150–400)
RBC: 2.41 MIL/uL — ABNORMAL LOW (ref 3.87–5.11)
RDW: 18.3 % — ABNORMAL HIGH (ref 11.5–15.5)
WBC: 10.4 10*3/uL (ref 4.0–10.5)
nRBC: 0 % (ref 0.0–0.2)

## 2019-07-30 LAB — COMPREHENSIVE METABOLIC PANEL
ALT: 12 U/L (ref 0–44)
AST: 24 U/L (ref 15–41)
Albumin: 2.5 g/dL — ABNORMAL LOW (ref 3.5–5.0)
Alkaline Phosphatase: 355 U/L — ABNORMAL HIGH (ref 38–126)
Anion gap: 12 (ref 5–15)
BUN: 97 mg/dL — ABNORMAL HIGH (ref 8–23)
CO2: 27 mmol/L (ref 22–32)
Calcium: 9.6 mg/dL (ref 8.9–10.3)
Chloride: 104 mmol/L (ref 98–111)
Creatinine, Ser: 3.51 mg/dL — ABNORMAL HIGH (ref 0.44–1.00)
GFR calc Af Amer: 14 mL/min — ABNORMAL LOW (ref >60)
GFR calc non Af Amer: 12 mL/min — ABNORMAL LOW (ref >60)
Glucose, Bld: 105 mg/dL — ABNORMAL HIGH (ref 70–99)
Potassium: 4.6 mmol/L (ref 3.5–5.1)
Sodium: 143 mmol/L (ref 135–145)
Total Bilirubin: 0.7 mg/dL (ref 0.3–1.2)
Total Protein: 7.3 g/dL (ref 6.5–8.1)

## 2019-07-30 LAB — PREPARE RBC (CROSSMATCH)

## 2019-07-30 LAB — URINALYSIS, COMPLETE (UACMP) WITH MICROSCOPIC
Bacteria, UA: NONE SEEN
Bilirubin Urine: NEGATIVE
Glucose, UA: NEGATIVE mg/dL
Hgb urine dipstick: NEGATIVE
Ketones, ur: NEGATIVE mg/dL
Leukocytes,Ua: NEGATIVE
Nitrite: NEGATIVE
Protein, ur: NEGATIVE mg/dL
Specific Gravity, Urine: 1.011 (ref 1.005–1.030)
pH: 9 — ABNORMAL HIGH (ref 5.0–8.0)

## 2019-07-30 LAB — ABO/RH: ABO/RH(D): O POS

## 2019-07-30 LAB — SARS CORONAVIRUS 2 BY RT PCR (HOSPITAL ORDER, PERFORMED IN ~~LOC~~ HOSPITAL LAB): SARS Coronavirus 2: NEGATIVE

## 2019-07-30 LAB — GLUCOSE, CAPILLARY: Glucose-Capillary: 88 mg/dL (ref 70–99)

## 2019-07-30 MED ORDER — LEVOTHYROXINE SODIUM 100 MCG PO TABS
100.0000 ug | ORAL_TABLET | Freq: Every day | ORAL | Status: DC
  Administered 2019-07-31 – 2019-08-01 (×2): 100 ug
  Filled 2019-07-30 (×3): qty 1

## 2019-07-30 MED ORDER — SODIUM CHLORIDE 0.9 % IV SOLN
80.0000 mg | Freq: Once | INTRAVENOUS | Status: AC
  Administered 2019-07-30: 80 mg via INTRAVENOUS
  Filled 2019-07-30: qty 80

## 2019-07-30 MED ORDER — VITAMIN D 25 MCG (1000 UNIT) PO TABS
2000.0000 [IU] | ORAL_TABLET | Freq: Every day | ORAL | Status: DC
  Administered 2019-07-31 – 2019-08-01 (×2): 2000 [IU]
  Filled 2019-07-30 (×2): qty 2

## 2019-07-30 MED ORDER — LEVOTHYROXINE SODIUM 100 MCG PO TABS
100.0000 ug | ORAL_TABLET | Freq: Every day | ORAL | Status: DC
  Filled 2019-07-30: qty 1

## 2019-07-30 MED ORDER — ACIDOPHILUS LACTOBACILLUS PO CAPS: ORAL_CAPSULE | Freq: Two times a day (BID) | ORAL | Status: DC

## 2019-07-30 MED ORDER — FOLIC ACID 1 MG PO TABS: 1.0000 mg | ORAL_TABLET | Freq: Every day | ORAL | Status: DC

## 2019-07-30 MED ORDER — LORATADINE 10 MG PO TABS: 10.0000 mg | ORAL_TABLET | Freq: Every day | ORAL | Status: DC

## 2019-07-30 MED ORDER — DILTIAZEM HCL 30 MG PO TABS
30.0000 mg | ORAL_TABLET | Freq: Two times a day (BID) | ORAL | Status: DC
  Administered 2019-07-30 – 2019-08-01 (×3): 30 mg
  Filled 2019-07-30 (×5): qty 1

## 2019-07-30 MED ORDER — MELATONIN 3 MG PO TABS
1.0000 | ORAL_TABLET | Freq: Every day | ORAL | Status: DC
  Administered 2019-07-31: 3 mg
  Filled 2019-07-30 (×2): qty 1

## 2019-07-30 MED ORDER — ADULT MULTIVITAMIN W/MINERALS CH
1.0000 | ORAL_TABLET | Freq: Every day | ORAL | Status: DC
  Administered 2019-07-31 – 2019-08-01 (×2): 1
  Filled 2019-07-30 (×2): qty 1

## 2019-07-30 MED ORDER — ORAL CARE MOUTH RINSE
15.0000 mL | OROMUCOSAL | Status: DC
  Administered 2019-07-30 – 2019-08-01 (×15): 15 mL via OROMUCOSAL

## 2019-07-30 MED ORDER — SODIUM CHLORIDE 0.9 % IV SOLN
8.0000 mg/h | INTRAVENOUS | Status: DC
  Administered 2019-07-30 – 2019-08-01 (×4): 8 mg/h via INTRAVENOUS
  Filled 2019-07-30 (×7): qty 80

## 2019-07-30 MED ORDER — LORATADINE 10 MG PO TABS
10.0000 mg | ORAL_TABLET | Freq: Every day | ORAL | Status: DC
  Administered 2019-07-31 – 2019-08-01 (×2): 10 mg
  Filled 2019-07-30 (×2): qty 1

## 2019-07-30 MED ORDER — CARVEDILOL 12.5 MG PO TABS: 12.5000 mg | ORAL_TABLET | Freq: Two times a day (BID) | ORAL | Status: DC

## 2019-07-30 MED ORDER — CHLORHEXIDINE GLUCONATE 0.12% ORAL RINSE (MEDLINE KIT)
15.0000 mL | Freq: Two times a day (BID) | OROMUCOSAL | Status: DC
  Administered 2019-07-30 – 2019-08-01 (×4): 15 mL via OROMUCOSAL

## 2019-07-30 MED ORDER — FERROUS SULFATE 325 (65 FE) MG PO TABS
325.0000 mg | ORAL_TABLET | Freq: Every day | ORAL | Status: DC
  Administered 2019-07-31 – 2019-08-01 (×2): 325 mg via ORAL
  Filled 2019-07-30 (×2): qty 1

## 2019-07-30 MED ORDER — PANTOPRAZOLE SODIUM 40 MG IV SOLR
40.0000 mg | Freq: Two times a day (BID) | INTRAVENOUS | Status: DC
  Filled 2019-07-30: qty 40

## 2019-07-30 MED ORDER — VITAMIN D 25 MCG (1000 UNIT) PO TABS: 2000.0000 [IU] | ORAL_TABLET | Freq: Every day | ORAL | Status: DC

## 2019-07-30 MED ORDER — CARVEDILOL 12.5 MG PO TABS
12.5000 mg | ORAL_TABLET | Freq: Two times a day (BID) | ORAL | Status: DC
  Administered 2019-07-31 – 2019-08-01 (×2): 12.5 mg
  Filled 2019-07-30 (×3): qty 1

## 2019-07-30 MED ORDER — DILTIAZEM HCL 30 MG PO TABS
30.0000 mg | ORAL_TABLET | Freq: Two times a day (BID) | ORAL | Status: DC
  Filled 2019-07-30: qty 1

## 2019-07-30 MED ORDER — IPRATROPIUM-ALBUTEROL 0.5-2.5 (3) MG/3ML IN SOLN: 3.0000 mL | Freq: Four times a day (QID) | RESPIRATORY_TRACT | Status: DC | PRN

## 2019-07-30 MED ORDER — ADULT MULTIVITAMIN W/MINERALS CH: 1.0000 | ORAL_TABLET | Freq: Every day | ORAL | Status: DC

## 2019-07-30 MED ORDER — FOLIC ACID 1 MG PO TABS
1.0000 mg | ORAL_TABLET | Freq: Every day | ORAL | Status: DC
  Administered 2019-07-31 – 2019-08-01 (×2): 1 mg
  Filled 2019-07-30 (×2): qty 1

## 2019-07-30 MED ORDER — RISAQUAD PO CAPS
1.0000 | ORAL_CAPSULE | Freq: Two times a day (BID) | ORAL | Status: DC
  Administered 2019-07-30 – 2019-08-01 (×4): 1 via ORAL
  Filled 2019-07-30 (×4): qty 1

## 2019-07-30 MED ORDER — MELATONIN 3 MG PO TABS
1.0000 | ORAL_TABLET | Freq: Every day | ORAL | Status: DC
  Filled 2019-07-30: qty 1

## 2019-07-30 MED ORDER — SODIUM CHLORIDE 0.9 % IV SOLN
10.0000 mL/h | Freq: Once | INTRAVENOUS | Status: AC
  Administered 2019-07-30: 10 mL/h via INTRAVENOUS

## 2019-07-30 NOTE — ED Notes (Signed)
Patient cleaned up after large BM.

## 2019-07-30 NOTE — ED Triage Notes (Signed)
Pt brought by [facility] from [facility]. Pt had bw today and pt's hemoglobin is 6.5, yesterday it was 7.5, pt had positive hemoccult test. Pt is on Elequis. Pt is sinus arrhythmia on monitor. Pt is A&Ox3. She's not sure where she's at.

## 2019-07-30 NOTE — ED Notes (Signed)
ED TO INPATIENT HANDOFF REPORT  ED Nurse Name and Phone #: Lorrin Goodell [phone]  S Name/Age/Gender Shirley Malone 78 y.o. female Room/Bed: 041C/041C  Code Status   Code Status: Prior  Home/SNF/Other Skilled nursing facility Patient oriented to: self, time and situation Is this baseline? Yes   Triage Complete: Triage complete  Chief Complaint [facility] pt GI Bleed  Triage Note Pt brought by [facility] from [facility]. Pt had bw today and pt's hemoglobin is 6.5, yesterday it was 7.5, pt had positive hemoccult test. Pt is on Elequis. Pt is sinus arrhythmia on monitor. Pt is A&Ox3. She's not sure where she's at.     Allergies Allergies  Allergen Reactions  . Clarithromycin Itching and Rash  . Codeine Itching    Other reaction(s): Unknown  . Latex Hives    Other reaction(s): Other Blisters   . Other     adhesive on tape    Level of Care/Admitting Diagnosis ED Disposition    ED Disposition Condition [facility]: [place] [100100]  Level of Care: Progressive [102]  I expect the patient will be discharged within 24 hours: No (not a candidate for 5C-Observation unit)  Covid Evaluation: Asymptomatic Screening Protocol (No Symptoms)  Diagnosis: Acute blood loss anemia [794801]  Admitting Physician: Bonnell Public [[id number]]  Attending Physician: Dana Allan I [[id number]]  PT Class (Do Not Modify): Observation [104]  PT Acc Code (Do Not Modify): Observation [10022]       B Medical/Surgery History Past Medical History:  Diagnosis Date  . Anemia   . Atrial fibrillation ([place])   . Chronic kidney disease   . Chronic respiratory failure ([place])   . Diabetes mellitus without complication ([place])   . DVT (deep venous thrombosis) ([place])   . Gastrointestinal bleed   . Morbid obesity ([place])   . 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 Thyroid disease       A IV Location/Drains/Wounds Patient  Lines/Drains/Airways Status   Active Line/Drains/Airways    Name:   Placement date:   Placement time:   Site:   Days:   Peripheral IV 07/30/19 Anterior;Proximal;Right Forearm   07/30/19    1805    Forearm   less than 1   Tracheostomy Portex 7 mm Cuffed   -    -    7 mm             Intake/Output Last 24 hours  Intake/Output Summary (Last 24 hours) at 07/30/2019 2013 Last data filed at 07/30/2019 1826 Gross per 24 hour  Intake 315 ml  Output -  Net 315 ml    Labs/Imaging Results for orders placed or performed during the hospital encounter of 07/30/19 (from the past 48 hour(s))  Comprehensive metabolic panel     Status: Abnormal   Collection Time: 07/30/19  4:14 PM  Result Value Ref Range   Sodium 143 135 - 145 mmol/L   Potassium 4.6 3.5 - 5.1 mmol/L   Chloride 104 98 - 111 mmol/L   CO2 27 22 - 32 mmol/L   Glucose, Bld 105 (H) 70 - 99 mg/dL   BUN 97 (H) 8 - 23 mg/dL   Creatinine, Ser 3.51 (H) 0.44 - 1.00 mg/dL   Calcium 9.6 8.9 - 10.3 mg/dL   Total Protein 7.3 6.5 - 8.1 g/dL   Albumin 2.5 (L) 3.5 - 5.0 g/dL   AST 24 15 - 41 U/L   ALT 12  0 - 44 U/L   Alkaline Phosphatase 355 (H) 38 - 126 U/L   Total Bilirubin 0.7 0.3 - 1.2 mg/dL   GFR calc non Af Amer 12 (L) >60 mL/min   GFR calc Af Amer 14 (L) >60 mL/min   Anion gap 12 5 - 15    Comment: Performed at [facility] [address], [place] [address]  CBC     Status: Abnormal   Collection Time: 07/30/19  4:14 PM  Result Value Ref Range   WBC 10.4 4.0 - 10.5 K/uL   RBC 2.41 (L) 3.87 - 5.11 MIL/uL   Hemoglobin 6.3 (LL) 12.0 - 15.0 g/dL    Comment: REPEATED TO VERIFY THIS CRITICAL RESULT HAS VERIFIED AND BEEN CALLED TO L.CHILTON RN BY KATHERINE MCCORMICK ON 08 18 2020 AT 1732, AND HAS BEEN READ BACK.     HCT 20.9 (L) 36.0 - 46.0 %   MCV 86.7 80.0 - 100.0 fL   MCH 26.1 26.0 - 34.0 pg   MCHC 30.1 30.0 - 36.0 g/dL   RDW 18.3 (H) 11.5 - 15.5 %   Platelets 237 150 - 400 K/uL   nRBC 0.0 0.0 - 0.2 %    Comment:  Performed at [facility] [address], [place], [place] [address]  Type and screen [place]     Status: None (Preliminary result)   Collection Time: 07/30/19  5:00 PM  Result Value Ref Range   ABO/RH(D) O POS    Antibody Screen NEG    Sample Expiration 08/02/2019,2359    Unit Number [id number]    Blood Component Type RBC LR PHER1    Unit division 00    Status of Unit ALLOCATED    Transfusion Status OK TO TRANSFUSE    Crossmatch Result Compatible    Unit Number [id number]    Blood Component Type RED CELLS,LR    Unit division 00    Status of Unit ISSUED    Transfusion Status OK TO TRANSFUSE    Crossmatch Result      Compatible Performed at [facility], [place] [address] [place], [place] [address]   ABO/Rh     Status: None (Preliminary result)   Collection Time: 07/30/19  5:00 PM  Result Value Ref Range   ABO/RH(D)      O POS Performed at [facility] [address] [place], [place] [address]   Prepare RBC     Status: None   Collection Time: 07/30/19  6:00 PM  Result Value Ref Range   Order Confirmation      ORDER PROCESSED BY BLOOD BANK Performed at [facility], [place] [address], [place], [place] [address]   SARS Coronavirus 2 [facility] order, Performed in [facility] hospital lab) Nasopharyngeal Nasopharyngeal Swab     Status: None   Collection Time: 07/30/19  6:18 PM   Specimen: Nasopharyngeal Swab  Result Value Ref Range   SARS Coronavirus 2 NEGATIVE NEGATIVE    Comment: (NOTE) If result is NEGATIVE SARS-CoV-2 target nucleic acids are NOT DETECTED. The SARS-CoV-2 RNA is generally detectable in upper and lower  respiratory specimens during the acute phase of infection. The lowest  concentration of SARS-CoV-2 viral copies this assay can detect is 250  copies / mL. A negative result does not preclude SARS-CoV-2 infection  and should not be used as the sole basis for treatment or other  patient management decisions.   A negative  result may occur with  improper specimen collection / handling, submission of specimen other  than nasopharyngeal swab, presence of viral mutation(s) within the  areas targeted by this assay, and inadequate number of viral copies  (<250 copies / mL). A negative result must be combined with clinical  observations, patient history, and epidemiological information. If result is POSITIVE SARS-CoV-2 target nucleic acids are DETECTED. The SARS-CoV-2 RNA is generally detectable in upper and lower  respiratory specimens dur ing the acute phase of infection.  Positive  results are indicative of active infection with SARS-CoV-2.  Clinical  correlation with patient history and other diagnostic information is  necessary to determine patient infection status.  Positive results do  not rule out bacterial infection or co-infection with other viruses. If result is PRESUMPTIVE POSTIVE SARS-CoV-2 nucleic acids MAY BE PRESENT.   A presumptive positive result was obtained on the submitted specimen  and confirmed on repeat testing.  While 2019 novel coronavirus  (SARS-CoV-2) nucleic acids may be present in the submitted sample  additional confirmatory testing may be necessary for epidemiological  and / or clinical management purposes  to differentiate between  SARS-CoV-2 and other Sarbecovirus currently known to infect humans.  If clinically indicated additional testing with an alternate test  methodology [phone]) is advised. The SARS-CoV-2 RNA is generally  detectable in upper and lower respiratory sp ecimens during the acute  phase of infection. The expected result is Negative. Fact Sheet for Patients:  StrictlyIdeas.no Fact Sheet for Healthcare Providers: BankingDealers.co.za This test is not yet approved or cleared by the [place] FDA and has been authorized for detection and/or diagnosis of SARS-CoV-2 by FDA under an Emergency Use  Authorization (EUA).  This EUA will remain in effect (meaning this test can be used) for the duration of the COVID-19 declaration under Section 564(b)(1) of the Act, 21 U.S.C. section 360bbb-3(b)(1), unless the authorization is terminated or revoked sooner. Performed at [facility], [place] [address], [place], [place] [address]    No results found.  Pending Labs Unresulted Labs (From admission, onward)   None      Vitals/Pain Today's Vitals   07/30/19 1900 07/30/19 1921 07/30/19 1930 07/30/19 2000  BP: 126/75 126/75 116/83 120/70  Pulse: 77 73 80 75  Resp: '16 16 20 16  ' Temp:      TempSrc:      SpO2: 100% 99% 100% 100%  Weight:      Height:      PainSc:        Isolation Precautions No active isolations  Medications Medications  0.9 %  sodium chloride infusion (has no administration in time range)  chlorhexidine gluconate (MEDLINE KIT) (PERIDEX) 0.12 % solution 15 mL (has no administration in time range)  MEDLINE mouth rinse (has no administration in time range)  pantoprazole (PROTONIX) injection 40 mg (has no administration in time range)    Mobility non-ambulatory High fall risk   Focused Assessments Cardiac Assessment Handoff:  Cardiac Rhythm: Other (Comment)(sinus arrhythmia) Lab Results  Component Value Date   TROPONINI 0.03 (HH) 08/25/2017   No results found for: DDIMER Does the Patient currently have chest pain? No     R Recommendations: See Admitting Provider Note  Report given to:   Additional Notes:

## 2019-07-30 NOTE — ED Notes (Signed)
Report given to Jen,RN on 61M

## 2019-07-30 NOTE — Consult Note (Signed)
NAME:  Shirley Malone, MRN:  [id number], DOB:  1941/03/14, LOS: 0 ADMISSION DATE:  07/30/2019, CONSULTATION DATE:  07/30/19 REFERRING MD:  Stark Jock  CHIEF COMPLAINT:  Vent Management   Brief History   Shirley Malone is a 78 y.o. female with chronic trach / PEG who resides at [facility], admitted 8/18 with anemia.   History of present illness   Shirley Malone is a 78 y.o. female who has a PMH including but not limited to chronic respiratory failure s/p trach and who resides at [facility], tracheobronchomalacia, A.fib (on apixaban), dCHF, DVT RLE s/p anticoagulation and IVC filter, GI bleeding, anemia, CKD, obesity, hypothyroidism, DM2.  She was admitted to [facility] in May 2020 for RLL pseudomonas PNA and E.coli UTI (s/p 7 days merrem), RLE DVT s/p anticoagulation and IVC filter, recurrent GI bleed (Notes state she was placed on heparin and transitioned to low dose 2.5mg  apixaban BID as she has hx GI bleeding with higher doses of anticoagulation.  Baseline Hgb around 7 - 8.5).  She was discharged to [facility] on 05/31/19.    She presented to [facility] ED 8/18 with Hgb 6.5.  It was apparently 7.5 one day earlier at [facility].  No overt signs of bleeding; however, FOBT positive.  She remained hemodynamically stable and was admitted to SDU by [facility].  PCCM asked to see in consultation for vent management.  Past Medical History  chronic respiratory failure s/p trach and who resides at [facility], tracheobronchomalacia, A.fib (on apixaban), dCHF, DVT RLE s/p anticoagulation and IVC filter, GI bleeding, anemia, CKD, obesity, hypothyroidism, DM2  Significant Hospital Events   8/18 > admit.  Consults:  PCCM.  Procedures:  PTA trach and peg  Significant Diagnostic Tests:  None.  Micro Data:  SARS CoV2 8/18 > negative 8/18 trach asp >>  Antimicrobials:  None.   Interim history/subjective:   Objective:  Blood pressure 106/76, pulse 82, temperature 98.3 F (36.8 C), temperature source Oral, resp. rate (!)  31, height 5\' 1"  (1.549 m), weight 99.8 kg, SpO2 100 %.    Vent Mode: PRVC FiO2 (%):  [30 %] 30 % Set Rate:  [16 bmp] 16 bmp Vt Set:  [500 mL] 500 mL PEEP:  [14 cmH20] 14 cmH20 Plateau Pressure:  [24 cmH20-30 cmH20] 30 cmH20   Intake/Output Summary (Last 24 hours) at 07/30/2019 2116 Last data filed at 07/30/2019 2107 Gross per 24 hour  Intake 630 ml  Output -  Net 630 ml   Filed Weights   07/30/19 1621  Weight: 99.8 kg    Examination: General:  Older adult female sitting upright in bed in NAD HEENT: MM pink/moist, midline 7 portex cuffed trach Neuro: Alert, mouths words, f/c, MAE CV: rr, no murmur PULM:  Non labored, breathing above set rate, coarse throughout with faint exp wheeze, thick green sputum  GI: obese, soft, bs active, Gtube clamped  Extremities: warm/dry, trace le edema  Skin: no rashes  Assessment & Plan:   Chronic respiratory failure - s/p trach. - Continue full vent support per [facility] settings.  Unclear why she needs PEEP 14 and 30% FiO2.  Taken to 5 peep with satisfactory O2 stats and obtain ABG in 1 hour - send trach asp  - duonebs prn  - Bronchial hygiene. - CXR now baseline and intermittently.  Anemia with + FOBT - no overt signs of bleeding. - Hold anticoagulation (2.5mg  apixaban BID). - Monitor for bleeding/ trend CBC -  2 units PRBC ordered.  Transfuse to maintain Hgb > 7. -  May need to consult GI depending on course.  Hx A.fib, RLE DVT s/p IVC filter. - Hold apixaban as above. - May need to avoid anticoagulation altogether as this appears to be a recurring issue with intolerance 2/2 bleeding.   Rest per primary team.  PCCM will see 1 - 2x per week for trach needs.  Please call if needed sooner.   Best Practice:  Diet: NPO Pain/Anxiety/Delirium protocol (if indicated): n/a VAP protocol (if indicated): yes DVT prophylaxis: SCD's / Heparin. GI prophylaxis: PPI q 12 Glucose control: trend on BMP Mobility: BR Code Status: Full  Family  Communication: no family at bedside.   Disposition: PCU, in ICU for chronic vent  Labs   CBC: Recent Labs  Lab 07/30/19 1614  WBC 10.4  HGB 6.3*  HCT 20.9*  MCV 86.7  PLT [id number]   Basic Metabolic Panel: Recent Labs  Lab 07/30/19 1614  NA 143  K 4.6  CL 104  CO2 27  GLUCOSE 105*  BUN 97*  CREATININE 3.51*  CALCIUM 9.6   GFR: Estimated Creatinine Clearance: 14.8 mL/min (A) (by C-G formula based on SCr of 3.51 mg/dL (H)). Recent Labs  Lab 07/30/19 1614  WBC 10.4   Liver Function Tests: Recent Labs  Lab 07/30/19 1614  AST 24  ALT 12  ALKPHOS 355*  BILITOT 0.7  PROT 7.3  ALBUMIN 2.5*   No results for input(s): LIPASE, AMYLASE in the last 168 hours. No results for input(s): AMMONIA in the last 168 hours. ABG    Component Value Date/Time   PHART 7.275 (L) 07/04/2019 0103   PCO2ART 45.9 07/04/2019 0103   PO2ART 92.0 07/04/2019 0103   HCO3 21.3 07/04/2019 0103   TCO2 23 07/04/2019 0103   ACIDBASEDEF 6.0 (H) 07/04/2019 0103   O2SAT 96.0 07/04/2019 0103    Coagulation Profile: No results for input(s): INR, PROTIME in the last 168 hours. Cardiac Enzymes: No results for input(s): CKTOTAL, CKMB, CKMBINDEX, TROPONINI in the last 168 hours. HbA1C: Hgb A1c MFr Bld  Date/Time Value Ref Range Status  07/28/2017 04:11 PM 5.8 (H) 4.8 - 5.6 % Final    Comment:    (NOTE) Pre diabetes:          5.7%-6.4% Diabetes:              >6.4% Glycemic control for   <7.0% adults with diabetes    CBG: No results for input(s): GLUCAP in the last 168 hours.  Review of Systems:   unable  Past medical history  She,  has a past medical history of Anemia, Atrial fibrillation ([place]), Chronic kidney disease, Chronic respiratory failure ([place]), Diabetes mellitus without complication ([place]), DVT (deep venous thrombosis) ([place]), Gastrointestinal bleed, Morbid obesity ([place]), Obesity hypoventilation syndrome ([place]), and Thyroid disease.   Surgical History   unable  Social History    reports that she has never smoked. She has never used smokeless tobacco. She reports previous alcohol use. She reports previous drug use.   Family history   Her family history is not on file.   Allergies Allergies  Allergen Reactions  . Clarithromycin Itching and Rash  . Codeine Itching    Other reaction(s): Unknown  . Latex Hives    Other reaction(s): Other Blisters   . Other     adhesive on tape     Home meds  Prior to Admission medications   Not on File   Kennieth Rad, MSN, AGACNP-BC [facility] Pgr: [phone] or if no answer  [phone] 07/30/2019, 9:17 PM

## 2019-07-30 NOTE — ED Notes (Signed)
Several people have tried to get IV on pt. Pt has had PICC lines in past

## 2019-07-30 NOTE — H&P (Signed)
History and Physical  Shirley Malone [id number] DOB: 01/12/1941 DOA: 07/30/2019  Referring physician: ER provider PCP: Townsend Roger, MD  Outpatient Specialists:    Patient coming from: [facility] (long-term acute care hospital)  Chief Complaint: Low hemoglobin and black stools.  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  Patient cannot give any significant history due to tracheostomy.  Collateral information reveals the patient has been noted to be having dark stools, with hemoglobin of 6.3 g/dL.  Apparently, patient's hemoglobin was noted to be 6.5 g/dL yesterday at the [facility].  Patient has been transferred to the hospital for further assessment and management.  As mentioned above, patient is on oral anticoagulation, and patient cannot give any history.  ED Course: On presentation to the ER, patient was afebrile, blood pressure of 114/89, heart rate of 80 with respiratory rate of 31.  Patient remains on the vent with vent setting as follows PRVC/16/500/30 and PEEP of 14.  Significant labs reveal hemoglobin of 6.3 g/dL, BUN of 97 and serum creatinine of 3.51 (last documented BUN was 112, serum creatinine of 2.57 on 09/04/2017)  Pertinent labs: Chemistry reveals sodium of 143, potassium of 4.6, chloride 104, CO2 27, BUN of 97 with creatinine of 3.51.  Alkaline phosphatase is elevated at 255, albumin of 2.5 and blood sugar of 105.  CBC reveals hemoglobin of 6.3, hematocrit of 20.9, WBC of 10.4, platelet count of 237.  Rapid test for COVID-19 came back negative.  EKG: Independently reviewed.  Atrial fibrillation with low voltage in the limb leads  Review of Systems:  Unobtainable.  Past Medical History:   Diagnosis Date  . Anemia   . Atrial fibrillation ([place])   . Chronic kidney disease   . Chronic respiratory failure ([place])   . Diabetes mellitus without complication ([place])   . DVT (deep venous thrombosis) ([place])   . Gastrointestinal bleed   . Morbid obesity ([place])   . 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 Thyroid disease      reports that she has never smoked. She has never used smokeless tobacco. She reports previous alcohol use. She reports previous drug use.  Allergies  Allergen Reactions  . Clarithromycin Itching and Rash  . Codeine Itching    Other reaction(s): Unknown  . Latex Hives    Other reaction(s): Other Blisters   . Other     adhesive on tape    No family history on file.   Prior to Admission medications   Medication Sig Start Date End Date Taking? Authorizing Provider  ACIDOPHILUS LACTOBACILLUS PO Take 1 tablet by mouth 2 (two) times daily.   Yes [provider]  Amino Acids-Protein Hydrolys (FEEDING SUPPLEMENT, PRO-STAT SUGAR FREE 64,) LIQD Take 30 mLs by mouth every 12 (twelve) hours.   Yes [provider]  apixaban (ELIQUIS) 5 MG TABS tablet Take 5 mg by mouth 2 (two) times daily.   Yes [provider]  carvedilol (COREG) 12.5 MG tablet Take 12.5 mg by mouth 2 (two) times daily.   Yes [provider]  cetirizine (ZYRTEC) 10 MG tablet Take 10 mg by mouth daily.   Yes [provider]  chlorhexidine (PERIDEX) 0.12 % solution Use as directed 15 mLs in the mouth or throat  2 (two) times daily.   Yes [provider]  Cholecalciferol (VITAMIN D3) 50 MCG (2000 UT) TABS Take 1 tablet by mouth daily.   Yes [provider]  Darbepoetin Alfa (ARANESP) 25 MCG/0.42ML SOSY injection Inject 25 mcg into the skin every 7 (seven) days.   Yes [provider]  diltiazem (CARDIZEM) 30 MG tablet Take 30 mg by mouth 2 (two) times daily.   Yes [provider]  ferrous sulfate  325 (65 FE) MG tablet Take 325 mg by mouth daily with breakfast.   Yes [provider]  folic acid (FOLVITE) 1 MG tablet Take 1 mg by mouth daily.   Yes [provider]  levothyroxine (SYNTHROID) 100 MCG tablet Take 100 mcg by mouth daily before breakfast.   Yes [provider]  loperamide (IMODIUM A-D) 2 MG tablet Take 2 mg by mouth daily.   Yes [provider]  magnesium oxide (MAG-OX) 400 MG tablet Take 400 mg by mouth every 12 (twelve) hours.   Yes [provider]  Melatonin 3 MG TABS Take 1 tablet by mouth at bedtime.   Yes [provider]  Multiple Vitamin (MULTIVITAMIN WITH MINERALS) TABS tablet Take 1 tablet by mouth daily.   Yes [provider]  pantoprazole (PROTONIX) 40 MG tablet Take 40 mg by mouth daily.   Yes [provider]  phenol (CHLORASEPTIC) 1.4 % LIQD Use as directed 1 spray in the mouth or throat every 12 (twelve) hours.   Yes [provider]  sertraline (ZOLOFT) 100 MG tablet Take 100 mg by mouth daily.   Yes [provider]    Physical Exam: Vitals:   07/30/19 1930 07/30/19 2000 07/30/19 2015 07/30/19 2030  BP: 116/83 120/70  114/89  Pulse: 80 75 82   Resp: 20 16 18  (!) 31  Temp:      TempSrc:      SpO2: 100% 100% 100%   Weight:      Height:        Constitutional:  . Appears calm and comfortable.  Patient is morbidly obese. Eyes:  . Pallor. No jaundice.  ENMT:  . external ears, nose appear normal Neck:  . Neck is supple. No JVD Respiratory:  . Decreased air entry globally with expiratory expiratory wheeze Cardiovascular:  . S1S2 . No LE extremity edema   Abdomen:  . Abdomen is morbidly obese, soft and non tender. Organs are difficult to assess. Neurologic:  . Awake and alert. . Moves all limbs.  Wt Readings from Last 3 Encounters:  07/30/19 99.8 kg    I have personally reviewed following labs and imaging studies  Labs on Admission:  CBC: Recent Labs   Lab 07/30/19 1614  WBC 10.4  HGB 6.3*  HCT 20.9*  MCV 86.7  PLT 123XX123   Basic Metabolic Panel: Recent Labs  Lab 07/30/19 1614  NA 143  K 4.6  CL 104  CO2 27  GLUCOSE 105*  BUN 97*  CREATININE 3.51*  CALCIUM 9.6   Liver Function Tests: Recent Labs  Lab 07/30/19 1614  AST 24  ALT 12  ALKPHOS 355*  BILITOT 0.7  PROT 7.3  ALBUMIN 2.5*   No results for input(s): LIPASE, AMYLASE in the last 168 hours. No results for input(s): AMMONIA in the last 168 hours. Coagulation Profile: No results for input(s): INR, PROTIME in the last 168 hours. Cardiac Enzymes: No results for input(s): CKTOTAL, CKMB, CKMBINDEX, TROPONINI in the last 168 hours. BNP (last 3 results)  No results for input(s): PROBNP in the last 8760 hours. HbA1C: No results for input(s): HGBA1C in the last 72 hours. CBG: No results for input(s): GLUCAP in the last 168 hours. Lipid Profile: No results for input(s): CHOL, HDL, LDLCALC, TRIG, CHOLHDL, LDLDIRECT in the last 72 hours. Thyroid Function Tests: No results for input(s): TSH, T4TOTAL, FREET4, T3FREE, THYROIDAB in the last 72 hours. Anemia Panel: No results for input(s): VITAMINB12, FOLATE, FERRITIN, TIBC, IRON, RETICCTPCT in the last 72 hours. Urine analysis:    Component Value Date/Time   COLORURINE YELLOW 08/26/2017 1706   APPEARANCEUR HAZY (A) 08/26/2017 1706   LABSPEC 1.010 08/26/2017 1706   PHURINE 5.0 08/26/2017 1706   GLUCOSEU NEGATIVE 08/26/2017 1706   HGBUR SMALL (A) 08/26/2017 1706   BILIRUBINUR NEGATIVE 08/26/2017 1706   KETONESUR NEGATIVE 08/26/2017 1706   PROTEINUR NEGATIVE 08/26/2017 1706   NITRITE NEGATIVE 08/26/2017 1706   LEUKOCYTESUR TRACE (A) 08/26/2017 1706   Sepsis Labs: @LABRCNTIP (procalcitonin:4,lacticidven:4) ) Recent Results (from the past 240 hour(s))  SARS Coronavirus 2 [facility] order, Performed in [facility] hospital lab) Nasopharyngeal Nasopharyngeal Swab     Status: None   Collection Time: 07/30/19  6:18 PM    Specimen: Nasopharyngeal Swab  Result Value Ref Range Status   SARS Coronavirus 2 NEGATIVE NEGATIVE Final    Comment: (NOTE) If result is NEGATIVE SARS-CoV-2 target nucleic acids are NOT DETECTED. The SARS-CoV-2 RNA is generally detectable in upper and lower  respiratory specimens during the acute phase of infection. The lowest  concentration of SARS-CoV-2 viral copies this assay can detect is 250  copies / mL. A negative result does not preclude SARS-CoV-2 infection  and should not be used as the sole basis for treatment or other  patient management decisions.  A negative result may occur with  improper specimen collection / handling, submission of specimen other  than nasopharyngeal swab, presence of viral mutation(s) within the  areas targeted by this assay, and inadequate number of viral copies  (<250 copies / mL). A negative result must be combined with clinical  observations, patient history, and epidemiological information. If result is POSITIVE SARS-CoV-2 target nucleic acids are DETECTED. The SARS-CoV-2 RNA is generally detectable in upper and lower  respiratory specimens dur ing the acute phase of infection.  Positive  results are indicative of active infection with SARS-CoV-2.  Clinical  correlation with patient history and other diagnostic information is  necessary to determine patient infection status.  Positive results do  not rule out bacterial infection or co-infection with other viruses. If result is PRESUMPTIVE POSTIVE SARS-CoV-2 nucleic acids MAY BE PRESENT.   A presumptive positive result was obtained on the submitted specimen  and confirmed on repeat testing.  While 2019 novel coronavirus  (SARS-CoV-2) nucleic acids may be present in the submitted sample  additional confirmatory testing may be necessary for epidemiological  and / or clinical management purposes  to differentiate between  SARS-CoV-2 and other Sarbecovirus currently known to infect humans.  If  clinically indicated additional testing with an alternate test  methodology [phone]) is advised. The SARS-CoV-2 RNA is generally  detectable in upper and lower respiratory sp ecimens during the acute  phase of infection. The expected result is Negative. Fact Sheet for Patients:  StrictlyIdeas.no Fact Sheet for Healthcare Providers: BankingDealers.co.za This test is not yet approved or cleared by the [place] FDA and has been authorized for detection and/or diagnosis of SARS-CoV-2 by FDA under an Emergency Use Authorization (EUA).  This EUA will remain  in effect (meaning this test can be used) for the duration of the COVID-19 declaration under Section 564(b)(1) of the Act, 21 U.S.C. section 360bbb-3(b)(1), unless the authorization is terminated or revoked sooner. Performed at [facility], [place] [address] [place], [place] [address]       Radiological Exams on Admission: No results found.  EKG: Independently reviewed.   Active Problems:   Acute blood loss anemia   Assessment/Plan Likely upper GI bleed/acute blood loss anemia: Admit patient for further assessment and management. Patient is on apixaban, will hold for now. Patient is currently being transfused with 2 units of packed red blood cells. Monitor H/H Suspect source of upper GI bleed We will start patient on Protonix drip N.p.o.  Consult GI in the morning  Chronic respiratory failure status post trach and on vent: Critical care team will manage. Current vent setting is as documented above.  AKI on CKD 4: Suspect secondary to volume depletion Urinalysis No IV fluids for now as patient is being transfused with packed red blood cells Monitor renal function and electrolytes Further management will depend on above.  Chronic atrial fibrillation/DVT: Patient was on apixaban prior to admission. Will hold anticoagulation for now GI to advise when it would be  safe to resume anticoagulation.    History of diabetes mellitus: Monitor closely during the hospital stay, especially, as patient will be n.p.o..  DVT prophylaxis: SCD Code Status: Full code Family Communication:  Disposition Plan: Likely back to LTAC facility ([facility]) Consults called: Please consult GI in the morning Admission status: Observation  Time spent: 65 minutes  Dana Allan, MD  [facility] Pager #: [phone] 7PM-7AM contact night coverage as above  07/30/2019, 8:56 PM

## 2019-07-30 NOTE — ED Provider Notes (Signed)
[facility] EMERGENCY DEPARTMENT Provider Note   CSN: [id number] Arrival date & time: 07/30/19  1607     History   Chief Complaint Chief Complaint  Patient presents with  . Blood In Stools    HPI Shirley Malone is a 78 y.o. female.     Patient is a 78 year old female with extensive past medical history including diabetes, morbid obesity, respiratory failure requiring continued ventilatory support.  She is currently residing at [facility] where she has a trach and is on a ventilator.  Patient sent here today for evaluation of low hemoglobin.  I am told that her hemoglobin was 6.5, when yesterday it was 7.5.  Hemoccult stool was positive.  Patient denies to me she is experiencing any discomfort, weakness, worsening breathing, or any other symptoms.  The history is provided by the patient.    Past Medical History:  Diagnosis Date  . Anemia   . Chronic kidney disease   . Chronic respiratory failure ([place])   . Diabetes mellitus without complication ([place])   . DVT (deep venous thrombosis) ([place])   . Morbid obesity ([place])   . Thyroid disease     There are no active problems to display for this patient.      OB History   No obstetric history on file.      Home Medications    Prior to Admission medications   Not on File    Family History No family history on file.  Social History Social History   Tobacco Use  . Smoking status: Never Smoker  . Smokeless tobacco: Never Used  Substance Use Topics  . Alcohol use: Not Currently  . Drug use: Not Currently     Allergies   Clarithromycin, Codeine, Latex, and Other   Review of Systems Review of Systems  All other systems reviewed and are negative.    Physical Exam Updated Vital Signs Ht 5\' 1"  (1.549 m)   Wt 99.8 kg   BMI 41.57 kg/m   Physical Exam Vitals signs and nursing note reviewed.  Constitutional:      General: She is not in acute distress.    Appearance: She is  well-developed. She is not diaphoretic.     Comments: Patient is a chronically ill-appearing patient.  She has a tracheostomy and is receiving ventilatory support from a ventilator.  History is somewhat difficult secondary to this.  HENT:     Head: Normocephalic and atraumatic.  Neck:     Musculoskeletal: Normal range of motion and neck supple.  Cardiovascular:     Rate and Rhythm: Normal rate and regular rhythm.     Heart sounds: No murmur. No friction rub. No gallop.   Pulmonary:     Effort: Pulmonary effort is normal. No respiratory distress.     Breath sounds: Normal breath sounds. No wheezing.  Abdominal:     General: Bowel sounds are normal. There is no distension.     Palpations: Abdomen is soft.     Tenderness: There is no abdominal tenderness.  Musculoskeletal: Normal range of motion.  Skin:    General: Skin is warm and dry.  Neurological:     Mental Status: She is alert and oriented to person, place, and time.      ED Treatments / Results  Labs (all labs ordered are listed, but only abnormal results are displayed) Labs Reviewed  COMPREHENSIVE METABOLIC PANEL  CBC  POC OCCULT BLOOD, ED  TYPE AND SCREEN    EKG None  Radiology No results found.  Procedures Procedures (including critical care time)  Medications Ordered in ED Medications - No data to display   Initial Impression / Assessment and Plan / ED Course  I have reviewed the triage vital signs and the nursing notes.  Pertinent labs & imaging results that were available during my care of the patient were reviewed by me and considered in my medical decision making (see chart for details).  Patient sent from [facility] for evaluation of GI bleed.  Patient has a declining hemoglobin and dark, heme positive stools.  Patient is on Xarelto for chronic immobilization, DVT, and A. Fib.  Patient's work-up shows a hemoglobin of 6.3.  Patient will be transfused and admitted to the hospitalist service.  As  patient is on the ventilator, care also discussed with critical care who feels as though patient can be admitted safely to the hospitalist service with [facility] consulting for vent management.  CRITICAL CARE Performed by: Veryl Speak Total critical care time: 45 minutes Critical care time was exclusive of separately billable procedures and treating other patients. Critical care was necessary to treat or prevent imminent or life-threatening deterioration. Critical care was time spent personally by me on the following activities: development of treatment plan with patient and/or surrogate as well as nursing, discussions with consultants, evaluation of patient's response to treatment, examination of patient, obtaining history from patient or surrogate, ordering and performing treatments and interventions, ordering and review of laboratory studies, ordering and review of radiographic studies, pulse oximetry and re-evaluation of patient's condition.   Final Clinical Impressions(s) / ED Diagnoses   Final diagnoses:  None    ED Discharge Orders    None       Veryl Speak, MD 07/30/19 [phone]

## 2019-07-30 NOTE — Progress Notes (Signed)
Patient placed on vent settings per [facility] that she was on at [facility]. They stated that there was no obturator at bedside for them to take. Currently has 7.0 Portex. Seems to be comfortable on our vent.

## 2019-07-30 NOTE — Progress Notes (Signed)
Patient transported from ED Room 41 to [id number] with no complications. Report given to unit RT.

## 2019-07-31 DIAGNOSIS — J9611 Chronic respiratory failure with hypoxia: Secondary | ICD-10-CM | POA: Diagnosis not present

## 2019-07-31 DIAGNOSIS — J9690 Respiratory failure, unspecified, unspecified whether with hypoxia or hypercapnia: Secondary | ICD-10-CM

## 2019-07-31 DIAGNOSIS — J9612 Chronic respiratory failure with hypercapnia: Secondary | ICD-10-CM | POA: Diagnosis not present

## 2019-07-31 DIAGNOSIS — Z7901 Long term (current) use of anticoagulants: Secondary | ICD-10-CM | POA: Diagnosis not present

## 2019-07-31 DIAGNOSIS — D62 Acute posthemorrhagic anemia: Secondary | ICD-10-CM | POA: Diagnosis not present

## 2019-07-31 DIAGNOSIS — D649 Anemia, unspecified: Secondary | ICD-10-CM

## 2019-07-31 LAB — BPAM RBC
Blood Product Expiration Date: 202009142359
Blood Product Expiration Date: 202009142359
ISSUE DATE / TIME: 202008181818
ISSUE DATE / TIME: 202008182203
Unit Type and Rh: 5100
Unit Type and Rh: 5100

## 2019-07-31 LAB — BASIC METABOLIC PANEL
Anion gap: 12 (ref 5–15)
BUN: 93 mg/dL — ABNORMAL HIGH (ref 8–23)
CO2: 24 mmol/L (ref 22–32)
Calcium: 9.3 mg/dL (ref 8.9–10.3)
Chloride: 107 mmol/L (ref 98–111)
Creatinine, Ser: 3.38 mg/dL — ABNORMAL HIGH (ref 0.44–1.00)
GFR calc Af Amer: 15 mL/min — ABNORMAL LOW (ref >60)
GFR calc non Af Amer: 13 mL/min — ABNORMAL LOW (ref >60)
Glucose, Bld: 85 mg/dL (ref 70–99)
Potassium: 4.2 mmol/L (ref 3.5–5.1)
Sodium: 143 mmol/L (ref 135–145)

## 2019-07-31 LAB — CBC
HCT: 23.5 % — ABNORMAL LOW (ref 36.0–46.0)
Hemoglobin: 7.6 g/dL — ABNORMAL LOW (ref 12.0–15.0)
MCH: 27.8 pg (ref 26.0–34.0)
MCHC: 32.3 g/dL (ref 30.0–36.0)
MCV: 86.1 fL (ref 80.0–100.0)
Platelets: 240 10*3/uL (ref 150–400)
RBC: 2.73 MIL/uL — ABNORMAL LOW (ref 3.87–5.11)
RDW: 17.2 % — ABNORMAL HIGH (ref 11.5–15.5)
WBC: 8.4 10*3/uL (ref 4.0–10.5)
nRBC: 0 % (ref 0.0–0.2)

## 2019-07-31 LAB — TYPE AND SCREEN
ABO/RH(D): O POS
Antibody Screen: NEGATIVE
Unit division: 0
Unit division: 0

## 2019-07-31 LAB — MRSA PCR SCREENING: MRSA by PCR: POSITIVE — AB

## 2019-07-31 LAB — GLUCOSE, CAPILLARY
Glucose-Capillary: 87 mg/dL (ref 70–99)
Glucose-Capillary: 67 mg/dL — ABNORMAL LOW (ref 70–99)
Glucose-Capillary: 70 mg/dL (ref 70–99)
Glucose-Capillary: 91 mg/dL (ref 70–99)

## 2019-07-31 LAB — MAGNESIUM: Magnesium: 2.3 mg/dL (ref 1.7–2.4)

## 2019-07-31 LAB — HEMOGLOBIN: Hemoglobin: 8.1 g/dL — ABNORMAL LOW (ref 12.0–15.0)

## 2019-07-31 LAB — PHOSPHORUS: Phosphorus: 3.5 mg/dL (ref 2.5–4.6)

## 2019-07-31 LAB — HEMATOCRIT: HCT: 25.5 % — ABNORMAL LOW (ref 36.0–46.0)

## 2019-07-31 LAB — TROPONIN I (HIGH SENSITIVITY): Troponin I (High Sensitivity): 16 ng/L (ref <18)

## 2019-07-31 LAB — TSH: TSH: 1.194 u[IU]/mL (ref 0.350–4.500)

## 2019-07-31 MED ORDER — DIPHENHYDRAMINE HCL 12.5 MG/5ML PO ELIX
25.0000 mg | ORAL_SOLUTION | Freq: Four times a day (QID) | ORAL | Status: DC | PRN
  Administered 2019-07-31: 25 mg
  Filled 2019-07-31: qty 10

## 2019-07-31 MED ORDER — DEXTROSE 50 % IV SOLN
25.0000 mL | Freq: Once | INTRAVENOUS | Status: AC
  Administered 2019-07-31: 25 mL via INTRAVENOUS

## 2019-07-31 MED ORDER — SODIUM CHLORIDE 0.9 % IV SOLN
INTRAVENOUS | Status: DC
  Administered 2019-07-31: 13:00:00 via INTRAVENOUS

## 2019-07-31 MED ORDER — VITAL HIGH PROTEIN PO LIQD: 1000.0000 mL | ORAL | Status: DC

## 2019-07-31 MED ORDER — MUPIROCIN 2 % EX OINT
1.0000 "application " | TOPICAL_OINTMENT | Freq: Two times a day (BID) | CUTANEOUS | Status: DC
  Administered 2019-07-31 – 2019-08-01 (×4): 1 via NASAL
  Filled 2019-07-31: qty 22

## 2019-07-31 MED ORDER — CHLORHEXIDINE GLUCONATE CLOTH 2 % EX PADS
6.0000 | MEDICATED_PAD | Freq: Every day | CUTANEOUS | Status: DC
  Administered 2019-07-31 – 2019-08-01 (×2): 6 via TOPICAL

## 2019-07-31 MED ORDER — DEXTROSE 50 % IV SOLN
INTRAVENOUS | Status: AC
  Administered 2019-07-31: 25 mL via INTRAVENOUS
  Filled 2019-07-31: qty 50

## 2019-07-31 MED ORDER — DEXTROSE-NACL 5-0.9 % IV SOLN
INTRAVENOUS | Status: DC
  Administered 2019-07-31 – 2019-08-01 (×2): via INTRAVENOUS

## 2019-07-31 MED ORDER — DIPHENHYDRAMINE HCL 25 MG PO CAPS
25.0000 mg | ORAL_CAPSULE | Freq: Four times a day (QID) | ORAL | Status: DC | PRN
  Filled 2019-07-31: qty 1

## 2019-07-31 MED ORDER — OSMOLITE 1.2 CAL PO LIQD
1000.0000 mL | ORAL | Status: DC
  Filled 2019-07-31 (×2): qty 1000

## 2019-07-31 NOTE — Consult Note (Addendum)
Referring Provider:  [facility] Primary Care Physician:  Nona Dell, Corene Cornea, MD Primary Gastroenterologist:  Althia Forts           Reason for Consultation:   GI bleed                 ASSESSMENT /  PLAN    78 yo female with multiple co-morbidities not limited to CKD, AFIB, DVT s/p IVC filter, chronic respiratory failure with trach / vent and hx of recurrent GI bleeds. Admitted with dark stoo (takes iron) and acute on chronic anemia. Hgb 6.3, baseline ~8. No evidence for GI bleeding today or since admission yesterday per RN. Patient has had several admissions at [facility] for recurrent Gi bleeds requiring transfusion. Major bleed ( melena) in 2016. Reports not visible but discharge summary describes EGD / colonoscopy / VCR with findings of only diverticulosis and hemorrhoids. With subsequent admissions for "rectal bleeding" GI treated empirically for hemorrhoids given risks / benefits ratio of repeating endoscopic evaluation.  -We spoke with Christia Reading over the telephone. He spoke with sibling and they are agreeable to EGD should be feel necessary. Ideally in the long term she could get off anticoagulant indefinitely. -No active bleeding. For now will continue PPI gtt   HPI:     Shirley Malone is a 78 y.o. female with multiple medical problems, resident of [facility]. Her PMH is significant for hypothyroidism, chronic respiratory failure s/p tracheostomy on vent, obesity, DVT, HTN, AFIB on Eliquis, DM, and CKD4. Admitted yesterday with black stools and a hgb of 6.5.   Records reviewed in [place]. Patient has been hospitalized several times dating back to at least 2016 for gastrointestinal bleeding on anticoagulation. For a massive GI upper GI bleed she was hospitalized at [facility] December 2016. Based on discharge summary underwent EGD / colonoscopy / VCE with findings of only diverticulosis and internal hemorrhoids. Hospitalized a few times between 2017 and Aug 2019 with "rectal  bleeding"  GI evaluated most if not all of those admissions, felt risks of endoscopic evaluation outweighed the benefits. She was treated empirically for hemorrhoids each time. [facility] follows her her the anemia felt to be anemia of chronic disease. Never had any evidence for iron deficiency. Hematology recommended discontinuation of anticoagulant if appropriate. Unfortunately patient had a DVT in May. She is s/p IVC filter.   Patient brought from [place] yesterday for passage of dark stools and anemia. She has chronic anemia with baseline hgb of 7-8 in [facility] and Care Everywhere.. The 12.6 in Epic on 07/04/19 obtain during ED visit for aspiration was probably inaccurate. Patient has no abdominal pain. She is A+O but not really aware of the bleeding. She takes oral iron and PPI outpatient  Past Medical History:  Diagnosis Date  . Anemia   . Atrial fibrillation ([place])   . Chronic kidney disease   . Chronic respiratory failure ([place])   . Diabetes mellitus without complication ([place])   . DVT (deep venous thrombosis) ([place])   . Gastrointestinal bleed   . Morbid obesity ([place])   . 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 Thyroid disease     Prior to Admission medications   Medication Sig Start Date End Date Taking? Authorizing Provider  ACIDOPHILUS LACTOBACILLUS PO Take 1 tablet by mouth 2 (two) times daily.   Yes [provider]  Amino Acids-Protein Hydrolys (FEEDING SUPPLEMENT, PRO-STAT SUGAR FREE 64,) LIQD Take 30 mLs by mouth every 12 (twelve)  hours.   Yes [provider]  apixaban (ELIQUIS) 5 MG TABS tablet Take 5 mg by mouth 2 (two) times daily.   Yes [provider]  carvedilol (COREG) 12.5 MG tablet Take 12.5 mg by mouth 2 (two) times daily.   Yes [provider]  cetirizine (ZYRTEC) 10 MG tablet Take 10 mg by mouth daily.   Yes [provider]  chlorhexidine (PERIDEX) 0.12 % solution Use as directed 15 mLs  in the mouth or throat 2 (two) times daily.   Yes [provider]  Cholecalciferol (VITAMIN D3) 50 MCG (2000 UT) TABS Take 1 tablet by mouth daily.   Yes [provider]  Darbepoetin Alfa (ARANESP) 25 MCG/0.42ML SOSY injection Inject 25 mcg into the skin every 7 (seven) days.   Yes [provider]  diltiazem (CARDIZEM) 30 MG tablet Take 30 mg by mouth 2 (two) times daily.   Yes [provider]  ferrous sulfate 325 (65 FE) MG tablet Take 325 mg by mouth daily with breakfast.   Yes [provider]  folic acid (FOLVITE) 1 MG tablet Take 1 mg by mouth daily.   Yes [provider]  levothyroxine (SYNTHROID) 100 MCG tablet Take 100 mcg by mouth daily before breakfast.   Yes [provider]  loperamide (IMODIUM A-D) 2 MG tablet Take 2 mg by mouth daily.   Yes [provider]  magnesium oxide (MAG-OX) 400 MG tablet Take 400 mg by mouth every 12 (twelve) hours.   Yes [provider]  Melatonin 3 MG TABS Take 1 tablet by mouth at bedtime.   Yes [provider]  Multiple Vitamin (MULTIVITAMIN WITH MINERALS) TABS tablet Take 1 tablet by mouth daily.   Yes [provider]  pantoprazole (PROTONIX) 40 MG tablet Take 40 mg by mouth daily.   Yes [provider]  phenol (CHLORASEPTIC) 1.4 % LIQD Use as directed 1 spray in the mouth or throat every 12 (twelve) hours.   Yes [provider]  sertraline (ZOLOFT) 100 MG tablet Take 100 mg by mouth daily.   Yes [provider]    Current Facility-Administered Medications  Medication Dose Route Frequency Provider Last Rate Last Dose  . acidophilus (RISAQUAD) capsule 1 capsule  1 capsule Oral BID Dana Allan I, MD   1 capsule at 07/31/19 0939  . carvedilol (COREG) tablet 12.5 mg  12.5 mg Per Tube BID Ronna Polio, RPH   12.5 mg at 07/31/19 2010  . chlorhexidine gluconate (MEDLINE KIT) (PERIDEX) 0.12 % solution 15 mL  15 mL Mouth Rinse  BID Jennelle Human B, NP   15 mL at 07/31/19 0900  . Chlorhexidine Gluconate Cloth 2 % PADS 6 each  6 each Topical Q0600 Bonnell Public, MD   6 each at 07/31/19 0940  . cholecalciferol (VITAMIN D3) tablet 2,000 Units  2,000 Units Per Tube Daily Ronna Polio, RPH   2,000 Units at 07/31/19 0712  . diltiazem (CARDIZEM) tablet 30 mg  30 mg Per Tube BID Ronna Polio, RPH   30 mg at 07/31/19 0939  . ferrous sulfate tablet 325 mg  325 mg Oral Q breakfast Dana Allan I, MD   325 mg at 07/31/19 0939  . folic acid (FOLVITE) tablet 1 mg  1 mg Per Tube Daily Ronna Polio, RPH   1 mg at 07/31/19 0940  . ipratropium-albuterol (DUONEB) 0.5-2.5 (3) MG/3ML nebulizer solution 3 mL  3 mL Nebulization Q6H PRN Arnell Asal,  NP      . levothyroxine (SYNTHROID) tablet 100 mcg  100 mcg Per Tube QAC breakfast Ronna Polio, RPH   100 mcg at 07/31/19 0514  . loratadine (CLARITIN) tablet 10 mg  10 mg Per Tube Daily Ronna Polio, RPH   10 mg at 07/31/19 0940  . MEDLINE mouth rinse  15 mL Mouth Rinse 10 times per day Jennelle Human B, NP   15 mL at 07/31/19 0945  . Melatonin TABS 3 mg  1 tablet Per Tube QHS Anabel Bene, Megan L, RPH      . multivitamin with minerals tablet 1 tablet  1 tablet Per Tube Daily Ronna Polio, RPH   1 tablet at 07/31/19 5697  . mupirocin ointment (BACTROBAN) 2 % 1 application  1 application Nasal BID Bonnell Public, MD   1 application at 94/80/16 [phone]  . pantoprazole (PROTONIX) 80 mg in sodium chloride 0.9 % 250 mL (0.32 mg/mL) infusion  8 mg/hr Intravenous Continuous Dana Allan I, MD 25 mL/hr at 07/31/19 1000 8 mg/hr at 07/31/19 1000    Allergies as of 07/30/2019 - Review Complete 07/30/2019  Allergen Reaction Noted  . Clarithromycin Itching and Rash 06/10/2017  . Codeine Itching 12/30/2013  . Latex Hives 12/30/2013  . Other  12/14/2016      Social History   Socioeconomic History  . Marital status: Widowed    Spouse name: Not on file   . Number of children: Not on file  . Years of education: Not on file  . Highest education level: Not on file  Occupational History  . Not on file  Social Needs  . Financial resource strain: Not on file  . Food insecurity    Worry: Not on file    Inability: Not on file  . Transportation needs    Medical: Not on file    Non-medical: Not on file  Tobacco Use  . Smoking status: Never Smoker  . Smokeless tobacco: Never Used  Substance and Sexual Activity  . Alcohol use: Not Currently  . Drug use: Not Currently  . Sexual activity: Not on file  Lifestyle  . Physical activity    Days per week: Not on file    Minutes per session: Not on file  . Stress: Not on file  Relationships  . Social Herbalist on phone: Not on file    Gets together: Not on file    Attends religious service: Not on file    Active member of club or organization: Not on file    Attends meetings of clubs or organizations: Not on file    Relationship status: Not on file  . Intimate partner violence    Fear of current or ex partner: Not on file    Emotionally abused: Not on file    Physically abused: Not on file    Forced sexual activity: Not on file  Other Topics Concern  . Not on file  Social History Narrative  . Not on file    Review of Systems: All systems reviewed and negative except where noted in HPI.  Physical Exam: Vital signs in last 24 hours: Temp:  [97.8 F (36.6 C)-99.6 F (37.6 C)] 97.8 F (36.6 C) (08/19 0700) Pulse Rate:  [70-100] 87 (08/19 0900) Resp:  [0-31] 20 (08/19 0900) BP: (99-139)/(62-98) 99/80 (08/19 0900) SpO2:  [97 %-100 %] 100 % (08/19 0900) FiO2 (%):  [30 %] 30 % (08/19 0748) Weight:  [90.9 kg-99.8 kg] 90.9  kg (08/18 2200) Last BM Date: 07/30/19 General:   Alert, obese female in NAD Psych:  Cooperative.  Eyes:  Pupils equal, sclera clear, no icterus.  Ears:  Normal auditory acuity. Neck:  Supple; no masses, tracheostomy.  Lungs:  Clear throughout to  auscultation.  Heart:  Regular rate and rhythm; no murmurs, no lower extremity edema Abdomen:  Soft, obese, non-distended, nontender, BS active, no obvious mass   Rectal:  No stool or even residual in vault  Msk:  Symmetrical without gross deformities. . Neurologic:  Alert and  oriented x4.    Intake/Output from previous day: 08/18 0701 - 08/19 0700 In: 1126.4 [I.V.:176.3; Blood:850; IV Piggyback:100.1] Out: 600 [Urine:600] Intake/Output this shift: Total I/O In: 98.4 [I.V.:98.4] Out: -   Lab Results: Recent Labs    07/30/19 1614 07/31/19 0245 07/31/19 0959  WBC 10.4 8.4  --   HGB 6.3* 7.6* 8.1*  HCT 20.9* 23.5* 25.5*  PLT 237 240  --    BMET Recent Labs    07/30/19 1614 07/31/19 0245  NA 143 143  K 4.6 4.2  CL 104 107  CO2 27 24  GLUCOSE 105* 85  BUN 97* 93*  CREATININE 3.51* 3.38*  CALCIUM 9.6 9.3   LFT Recent Labs    07/30/19 1614  PROT 7.3  ALBUMIN 2.5*  AST 24  ALT 12  ALKPHOS 355*  BILITOT 0.7   PT/INR No results for input(s): LABPROT, INR in the last 72 hours. Hepatitis Panel No results for input(s): HEPBSAG, HCVAB, HEPAIGM, HEPBIGM in the last 72 hours.   . CBC Latest Ref Rng & Units 07/31/2019 07/31/2019 07/30/2019  WBC 4.0 - 10.5 K/uL - 8.4 10.4  Hemoglobin 12.0 - 15.0 g/dL 8.1(L) 7.6(L) 6.3(LL)  Hematocrit 36.0 - 46.0 % 25.5(L) 23.5(L) 20.9(L)  Platelets 150 - 400 K/uL - 240 237    . CMP Latest Ref Rng & Units 07/31/2019 07/30/2019 07/04/2019  Glucose 70 - 99 mg/dL 85 105(H) -  BUN 8 - 23 mg/dL 93(H) 97(H) -  Creatinine 0.44 - 1.00 mg/dL 3.38(H) 3.51(H) -  Sodium 135 - 145 mmol/L 143 143 139  Potassium 3.5 - 5.1 mmol/L 4.2 4.6 5.2(H)  Chloride 98 - 111 mmol/L 107 104 -  CO2 22 - 32 mmol/L 24 27 -  Calcium 8.9 - 10.3 mg/dL 9.3 9.6 -  Total Protein 6.5 - 8.1 g/dL - 7.3 -  Total Bilirubin 0.3 - 1.2 mg/dL - 0.7 -  Alkaline Phos 38 - 126 U/L - 355(H) -  AST 15 - 41 U/L - 24 -  ALT 0 - 44 U/L - 12 -   Studies/Results: Dg Chest Port 1  View  Result Date: 07/31/2019 CLINICAL DATA:  Respiratory failure. EXAM: PORTABLE CHEST 1 VIEW COMPARISON:  08/27/2017 FINDINGS: Tracheostomy tube tip at the thoracic inlet. Cardiomegaly which appears similar to prior exam. Tortuous thoracic aorta. Hazy opacity at the right lung base consistent with pleural effusion and atelectasis/airspace disease. Vascular congestion without overt edema. No pneumothorax. Chronic change about both shoulders. IMPRESSION: 1. Hazy opacity at the right lung base consistent with pleural effusion and atelectasis/airspace disease. Chronic cardiomegaly. Vascular congestion. 2. Tracheostomy tube tip at the thoracic inlet. Electronically Signed   By: Keith Rake M.D.   On: 07/31/2019 00:38    Active Problems:   Acute blood loss anemia   Chronic respiratory failure ([place])    Tye Savoy, NP-C @  07/31/2019, 10:41 AM

## 2019-07-31 NOTE — Progress Notes (Signed)
Sputum collected and sent to lab at this time °

## 2019-07-31 NOTE — Consult Note (Signed)
NAME:  Shirley Malone, MRN:  [id number], DOB:  [birth year], LOS: 0 ADMISSION DATE:  07/30/2019, CONSULTATION DATE:  07/31/19 REFERRING MD:  Marthenia Rolling, CHIEF COMPLAINT:  Black stool   Brief History   78 year old African-American female SNF patient vent-dependent respiratory insufficiency, CKD, Afib on AC, prior GIB, prior DVT, DM presenting for evaluation of low hgb.  History of present illness   78 year old African-American female SNF patient vent-dependent respiratory insufficiency, CKD, Afib on AC, prior GIB, prior DVT, DM presenting for evaluation of low hgb.  History per chart review as patient is nonverbal.  Patient probably has chronic GIB, DoAC has been held, patient has been transfused, and GI to be consulted by primary.  PCCM consulted for vent management.  ABG looks benign.   CXR without any acute infiltrates, some atelectasis and mild venous congestion.  Past Medical History  Chronic respiratory failure s/p trach Chronic dysphagia s/p PEG Prior GIB Prior DVT Afib on AC Obesity OHS CKD HTN [facility]   8/18-8/19 admitted transfused 2 units  Consults:  PCCM GI>>  Procedures:  None  Significant Diagnostic Tests:  07/30/19 CXR without any acute infiltrates, some atelectasis and mild venous congestion.   Micro Data:  Sputum culture drawn for some reason  Antimicrobials:  None   Interim history/subjective:  Admitted, cannot get any subjective.  Objective   Blood pressure 129/80, pulse 77, temperature 97.8 F (36.6 C), temperature source Axillary, resp. rate 16, height 5\' 1"  (1.549 m), weight 90.9 kg, SpO2 99 %.    Vent Mode: PRVC FiO2 (%):  [30 %] 30 % Set Rate:  [16 bmp] 16 bmp Vt Set:  [500 mL] 500 mL PEEP:  [5 cmH20-14 cmH20] 5 cmH20 Plateau Pressure:  [11 cmH20-30 cmH20] 11 cmH20   Intake/Output Summary (Last 24 hours) at 07/31/2019 0716 Last data filed at 07/31/2019 0600 Gross per 24 hour  Intake 1126.42 ml  Output 600 ml  Net  526.42 ml   Filed Weights   07/30/19 1621 07/30/19 2200  Weight: 99.8 kg 90.9 kg    Examination: GEN: chronically ill woman in NAD HEENT: Trach in place with minimal thick secretions CV: Irregular, ext warm PULM: passive on vent, lungs diminished at bases GI: soft, PEG in place EXT: No edema NEURO: Sleeping, deferred PSYCH: Sleeping, deferred SKIN: No rashes   Resolved Hospital Problem list   NA  Assessment & Plan:  GIB- acute vs. Chronic.  S/p 2 units pRBC.  On PPI gtt. - Management per primary  Chronic respiratory failure- CXR/ABG benign.  Continue vent support, VAP prevention bundle.  Daily CXR and ABG not needed unless change in clinical status.  CKD- stable  Relative protein calorie malnutrition   Best practice:  Diet: NPO Pain/Anxiety/Delirium protocol (if indicated): NA VAP protocol (if indicated): Ordered DVT prophylaxis: SCDs GI prophylaxis: PPI drip Glucose control: Not needed, checking CBG q6h Mobility: BR Code Status: Full Family Communication: Per primary Disposition: ICU due to vent requirement  Labs   CBC: Recent Labs  Lab 07/30/19 1614 07/31/19 0245  WBC 10.4 8.4  HGB 6.3* 7.6*  HCT 20.9* 23.5*  MCV 86.7 86.1  PLT 237 [id number]    Basic Metabolic Panel: Recent Labs  Lab 07/30/19 1614 07/31/19 0245  NA 143 143  K 4.6 4.2  CL 104 107  CO2 27 24  GLUCOSE 105* 85  BUN 97* 93*  CREATININE 3.51* 3.38*  CALCIUM 9.6 9.3  MG  --  2.3  PHOS  --  3.5  GFR: Estimated Creatinine Clearance: 14.5 mL/min (A) (by C-G formula based on SCr of 3.38 mg/dL (H)). Recent Labs  Lab 07/30/19 1614 07/31/19 0245  WBC 10.4 8.4    Liver Function Tests: Recent Labs  Lab 07/30/19 1614  AST 24  ALT 12  ALKPHOS 355*  BILITOT 0.7  PROT 7.3  ALBUMIN 2.5*   No results for input(s): LIPASE, AMYLASE in the last 168 hours. No results for input(s): AMMONIA in the last 168 hours.  ABG    Component Value Date/Time   PHART 7.432 07/30/2019 2138    PCO2ART 44.1 07/30/2019 2138   PO2ART 125 (H) 07/30/2019 2138   HCO3 28.9 (H) 07/30/2019 2138   TCO2 23 07/04/2019 0103   ACIDBASEDEF 6.0 (H) 07/04/2019 0103   O2SAT 98.7 07/30/2019 2138     Coagulation Profile: No results for input(s): INR, PROTIME in the last 168 hours.  Cardiac Enzymes: No results for input(s): CKTOTAL, CKMB, CKMBINDEX, TROPONINI in the last 168 hours.  HbA1C: Hgb A1c MFr Bld  Date/Time Value Ref Range Status  07/28/2017 04:11 PM 5.8 (H) 4.8 - 5.6 % Final    Comment:    (NOTE) Pre diabetes:          5.7%-6.4% Diabetes:              >6.4% Glycemic control for   <7.0% adults with diabetes     CBG: Recent Labs  Lab 07/30/19 2134  GLUCAP 88    Review of Systems:   Cannot obtain  Past Medical History  She,  has a past medical history of Anemia, Atrial fibrillation ([place]), Chronic kidney disease, Chronic respiratory failure ([place]), Diabetes mellitus without complication ([place]), DVT (deep venous thrombosis) ([place]), Gastrointestinal bleed, Morbid obesity ([place]), Obesity hypoventilation syndrome ([place]), and Thyroid disease.    Social History   reports that she has never smoked. She has never used smokeless tobacco. She reports previous alcohol use. She reports previous drug use.   Family History   Her family history is not on file.   Allergies Allergies  Allergen Reactions  . Clarithromycin Itching and Rash  . Codeine Itching    Other reaction(s): Unknown  . Latex Hives    Other reaction(s): Other Blisters   . Other     adhesive on tape     Home Medications  Prior to Admission medications   Medication Sig Start Date End Date Taking? Authorizing Provider  ACIDOPHILUS LACTOBACILLUS PO Take 1 tablet by mouth 2 (two) times daily.   Yes [provider]  Amino Acids-Protein Hydrolys (FEEDING SUPPLEMENT, PRO-STAT SUGAR FREE 64,) LIQD Take 30 mLs by mouth every 12 (twelve) hours.   Yes [provider]  apixaban (ELIQUIS) 5 MG TABS  tablet Take 5 mg by mouth 2 (two) times daily.   Yes [provider]  carvedilol (COREG) 12.5 MG tablet Take 12.5 mg by mouth 2 (two) times daily.   Yes [provider]  cetirizine (ZYRTEC) 10 MG tablet Take 10 mg by mouth daily.   Yes [provider]  chlorhexidine (PERIDEX) 0.12 % solution Use as directed 15 mLs in the mouth or throat 2 (two) times daily.   Yes [provider]  Cholecalciferol (VITAMIN D3) 50 MCG (2000 UT) TABS Take 1 tablet by mouth daily.   Yes [provider]  Darbepoetin Alfa (ARANESP) 25 MCG/0.42ML SOSY injection Inject 25 mcg into the skin every 7 (seven) days.   Yes [provider]  diltiazem (CARDIZEM) 30 MG tablet Take  30 mg by mouth 2 (two) times daily.   Yes [provider]  ferrous sulfate 325 (65 FE) MG tablet Take 325 mg by mouth daily with breakfast.   Yes [provider]  folic acid (FOLVITE) 1 MG tablet Take 1 mg by mouth daily.   Yes [provider]  levothyroxine (SYNTHROID) 100 MCG tablet Take 100 mcg by mouth daily before breakfast.   Yes [provider]  loperamide (IMODIUM A-D) 2 MG tablet Take 2 mg by mouth daily.   Yes [provider]  magnesium oxide (MAG-OX) 400 MG tablet Take 400 mg by mouth every 12 (twelve) hours.   Yes [provider]  Melatonin 3 MG TABS Take 1 tablet by mouth at bedtime.   Yes [provider]  Multiple Vitamin (MULTIVITAMIN WITH MINERALS) TABS tablet Take 1 tablet by mouth daily.   Yes [provider]  pantoprazole (PROTONIX) 40 MG tablet Take 40 mg by mouth daily.   Yes [provider]  phenol (CHLORASEPTIC) 1.4 % LIQD Use as directed 1 spray in the mouth or throat every 12 (twelve) hours.   Yes [provider]  sertraline (ZOLOFT) 100 MG tablet Take 100 mg by mouth daily.   Yes [provider]

## 2019-07-31 NOTE — Progress Notes (Addendum)
Initial Nutrition Assessment RD working remotely.  DOCUMENTATION CODES:   Obesity unspecified  INTERVENTION:   Begin trickle TF via PEG:   Osmolite 1.2 at 20 ml/h (576 kcal, 27 gm protein, 394 ml free water daily)    Monitor for TF tolerance, if tolerating well tomorrow, recommend increase to goal rate of 45 ml/h (1080 ml per day) with Pro-stat 30 ml BID.   Provides 1496 kcal, 90 gm protein, 886 ml free water daily  NUTRITION DIAGNOSIS:   Inadequate oral intake related to inability to eat as evidenced by NPO status.  GOAL:   Patient will meet greater than or equal to 90% of their needs  MONITOR:   Labs, Skin, Vent status, I & O's  REASON FOR ASSESSMENT:   Ventilator    ASSESSMENT:   78 yo female admitted from [facility] with black stool and low Hgb. PMH includes trach, vent dependent, PEG, DM, anemia, obesity, CKD, OHS, A fib, GI bleed.   S/P blood transfusion on admission.  GI consulted. Patient with chronic anemia. Holding off on endoscopy today. Receiving IV Protonix.  PEG in place, unsure of usual TF regimen. Received MD Consult to begin TF at a low rate.   Patient is currently intubated on ventilator support MV: 7.1 L/min Temp (24hrs), Avg:98.8 F (37.1 C), Min:97.8 F (36.6 C), Max:99.6 F (37.6 C)   Labs reviewed. BUN 93 (H), creatinine 3.38 (H), Hgb 7.6 (L) CBG: 88 last night  Medications reviewed and include acidophilus, vitamin D3, ferrous sulfate, folic acid, MVI.  NUTRITION - FOCUSED PHYSICAL EXAM:  unable to complete  Diet Order:   Diet Order            Diet NPO time specified Except for: Sips with Meds  Diet effective now              EDUCATION NEEDS:   No education needs have been identified at this time  Skin:  Skin Assessment: Reviewed RN Assessment  Last BM:  8/18  Height:   Ht Readings from Last 1 Encounters:  07/30/19 5\' 1"  (D34-534 m)    Weight:   Wt Readings from Last 1 Encounters:  07/30/19 90.9 kg     Ideal Body Weight:  47.7 kg  BMI:  Body mass index is 37.86 kg/m.  Estimated Nutritional Needs:   Kcal:  1400-1600  Protein:  95 gm  Fluid:  >/= 1.5 L   Molli Barrows, RD, LDN, CNSC Pager [phone] After Hours Pager [phone]

## 2019-07-31 NOTE — Progress Notes (Addendum)
PROGRESS NOTE    Shirley Malone  ZDG:[id number] DOB: [birth year] DOA: 07/30/2019 PCP: Townsend Roger, MD   Brief Narrative:  Patient is a 78 year old female, long-term resident at [facility], with history of chronic respiratory failure status post trach on vent, OHS, morbid obesity, GI bleed, DVT, A. fib on Eliquis, anemia, CKD stage IV, diabetes mellitus who presented here with complaints of dark stool and hemoglobin of 6.3.  Patient was transfused with 2 units of PRBC.  Hemoglobin this morning stable.  GI consulted today for evaluation of GI bleed.  Currently hemodynamically stable.  Assessment & Plan:   Active Problems:   Acute blood loss anemia   Chronic respiratory failure (HCC)   Suspected upper GI bleed/acute blood loss anemia: Currently hemodynamically stable.  On Eliquis for A. fib and DVT.  Transfused with 2 units of   PRBC.  This morning hemoglobin stable at 8.1.  Suspected upper GI bleed. No active bleeding. Started on Protonix drip.  N.p.o.  GI following  Chronic normocytic anemia: Continue iron supplementation.  AKI on CKD stage IV: Baseline creatinine around 3.  Her creatinine on 6/20 was 2.3.  Patient appears slightly dehydrated.  Will start on gentle IV fluids.  Check BMP tomorrow  Chronic respiratory failure: Status post trach, currently on vent.  PCCM managing vent.  Respiratory status stable  Chronic A. fib/DVT: On Eliquis.  On hold.  We will follow-up with GI when to start anticoagulation.  Rate well controlled.  On Cardizem  History of diabetes mellitus type 2: Currently on sliding scale insulin    Nutrition Problem: Inadequate oral intake Etiology: inability to eat      DVT prophylaxis: SCD Code Status: Full Family Communication: None present at the bedside Disposition Plan: Back to [facility] after full work-up   Consultants: GI  Procedures: None  Antimicrobials:  Anti-infectives (From admission, onward)   None      Subjective:   Patient seen and examined the bedside this morning.  Currently hemodynamically stable.  Denies any complaints.  Alert and oriented.  Very comfortable.  Denies any abdominal pain.  She was wondering when she can go home.  Objective: Vitals:   07/31/19 0748 07/31/19 0800 07/31/19 0900 07/31/19 1047  BP: 132/89 (!) 139/95 99/80 121/81  Pulse:  75 87 82  Resp: 18 (!) 0 20 16  Temp:      TempSrc:      SpO2: 100% 100% 100%   Weight:      Height:        Intake/Output Summary (Last 24 hours) at 07/31/2019 1104 Last data filed at 07/31/2019 1000 Gross per 24 hour  Intake 1224.82 ml  Output 600 ml  Net 624.82 ml   Filed Weights   07/30/19 1621 07/30/19 2200  Weight: 99.8 kg 90.9 kg    Examination:  General exam: Morbidly obese, comfortable HEENT:PERRL,Oral mucosa dry, status post trach  respiratory system: Bilateral equal air entry, normal vesicular breath sounds, no wheezes or crackles, on vent Cardiovascular system: S1 & S2 heard, RRR. No JVD, murmurs, rubs, gallops or clicks. No pedal edema. Gastrointestinal system: Abdomen is nondistended, soft and nontender. No organomegaly or masses felt. Normal bowel sounds heard. Central nervous system: Alert and oriented. No focal neurological deficits. Extremities: No edema, no clubbing ,no cyanosis, distal peripheral pulses palpable. Skin: No rashes, lesions or ulcers,no icterus ,no pallor   Data Reviewed: I have personally reviewed following labs and imaging studies  CBC: Recent Labs  Lab 07/30/19 1614 07/31/19 0245  07/31/19 0959  WBC 10.4 8.4  --   HGB 6.3* 7.6* 8.1*  HCT 20.9* 23.5* 25.5*  MCV 86.7 86.1  --   PLT 237 240  --    Basic Metabolic Panel: Recent Labs  Lab 07/30/19 1614 07/31/19 0245  NA 143 143  K 4.6 4.2  CL 104 107  CO2 27 24  GLUCOSE 105* 85  BUN 97* 93*  CREATININE 3.51* 3.38*  CALCIUM 9.6 9.3  MG  --  2.3  PHOS  --  3.5   GFR: Estimated Creatinine Clearance: 14.5 mL/min (A) (by C-G formula  based on SCr of 3.38 mg/dL (H)). Liver Function Tests: Recent Labs  Lab 07/30/19 1614  AST 24  ALT 12  ALKPHOS 355*  BILITOT 0.7  PROT 7.3  ALBUMIN 2.5*   No results for input(s): LIPASE, AMYLASE in the last 168 hours. No results for input(s): AMMONIA in the last 168 hours. Coagulation Profile: No results for input(s): INR, PROTIME in the last 168 hours. Cardiac Enzymes: No results for input(s): CKTOTAL, CKMB, CKMBINDEX, TROPONINI in the last 168 hours. BNP (last 3 results) No results for input(s): PROBNP in the last 8760 hours. HbA1C: No results for input(s): HGBA1C in the last 72 hours. CBG: Recent Labs  Lab 07/30/19 2134  GLUCAP 88   Lipid Profile: No results for input(s): CHOL, HDL, LDLCALC, TRIG, CHOLHDL, LDLDIRECT in the last 72 hours. Thyroid Function Tests: Recent Labs    07/31/19 0245  TSH 1.194   Anemia Panel: No results for input(s): VITAMINB12, FOLATE, FERRITIN, TIBC, IRON, RETICCTPCT in the last 72 hours. Sepsis Labs: No results for input(s): PROCALCITON, LATICACIDVEN in the last 168 hours.  Recent Results (from the past 240 hour(s))  SARS Coronavirus 2 [facility] order, Performed in [facility] hospital lab) Nasopharyngeal Nasopharyngeal Swab     Status: None   Collection Time: 07/30/19  6:18 PM   Specimen: Nasopharyngeal Swab  Result Value Ref Range Status   SARS Coronavirus 2 NEGATIVE NEGATIVE Final    Comment: (NOTE) If result is NEGATIVE SARS-CoV-2 target nucleic acids are NOT DETECTED. The SARS-CoV-2 RNA is generally detectable in upper and lower  respiratory specimens during the acute phase of infection. The lowest  concentration of SARS-CoV-2 viral copies this assay can detect is 250  copies / mL. A negative result does not preclude SARS-CoV-2 infection  and should not be used as the sole basis for treatment or other  patient management decisions.  A negative result may occur with  improper specimen collection / handling, submission of  specimen other  than nasopharyngeal swab, presence of viral mutation(s) within the  areas targeted by this assay, and inadequate number of viral copies  (<250 copies / mL). A negative result must be combined with clinical  observations, patient history, and epidemiological information. If result is POSITIVE SARS-CoV-2 target nucleic acids are DETECTED. The SARS-CoV-2 RNA is generally detectable in upper and lower  respiratory specimens dur ing the acute phase of infection.  Positive  results are indicative of active infection with SARS-CoV-2.  Clinical  correlation with patient history and other diagnostic information is  necessary to determine patient infection status.  Positive results do  not rule out bacterial infection or co-infection with other viruses. If result is PRESUMPTIVE POSTIVE SARS-CoV-2 nucleic acids MAY BE PRESENT.   A presumptive positive result was obtained on the submitted specimen  and confirmed on repeat testing.  While 2019 novel coronavirus  (SARS-CoV-2) nucleic acids may be present in the  submitted sample  additional confirmatory testing may be necessary for epidemiological  and / or clinical management purposes  to differentiate between  SARS-CoV-2 and other Sarbecovirus currently known to infect humans.  If clinically indicated additional testing with an alternate test  methodology [phone]) is advised. The SARS-CoV-2 RNA is generally  detectable in upper and lower respiratory sp ecimens during the acute  phase of infection. The expected result is Negative. Fact Sheet for Patients:  StrictlyIdeas.no Fact Sheet for Healthcare Providers: BankingDealers.co.za This test is not yet approved or cleared by the [place] FDA and has been authorized for detection and/or diagnosis of SARS-CoV-2 by FDA under an Emergency Use Authorization (EUA).  This EUA will remain in effect (meaning this test can be used) for the  duration of the COVID-19 declaration under Section 564(b)(1) of the Act, 21 U.S.C. section 360bbb-3(b)(1), unless the authorization is terminated or revoked sooner. Performed at [facility], [place] [address] [place], [place] [address]   MRSA PCR Screening     Status: Abnormal   Collection Time: 07/30/19 10:14 PM   Specimen: Nasopharyngeal  Result Value Ref Range Status   MRSA by PCR POSITIVE (A) NEGATIVE Final    Comment:        The GeneXpert MRSA Assay (FDA approved for NASAL specimens only), is one component of a comprehensive MRSA colonization surveillance program. It is not intended to diagnose MRSA infection nor to guide or monitor treatment for MRSA infections. RESULT CALLED TO, READ BACK BY AND VERIFIED WITH: [facility] RN [id number] 07/31/2019 MITCHELL,L Performed at [facility], [place] [address] [place], [place] [address]   Culture, respiratory (non-expectorated)     Status: None (Preliminary result)   Collection Time: 07/31/19  4:09 AM   Specimen: Tracheal Aspirate; Respiratory  Result Value Ref Range Status   Specimen Description TRACHEAL ASPIRATE  Final   Special Requests NONE  Final   Gram Stain   Final    ABUNDANT WBC PRESENT, PREDOMINANTLY PMN MODERATE GRAM POSITIVE RODS MODERATE GRAM NEGATIVE RODS RARE GRAM POSITIVE COCCI Performed at [facility], [place] [address] [place],  [address]    Culture PENDING  Incomplete   Report Status PENDING  Incomplete         Radiology Studies: Dg Chest Port 1 View  Result Date: 07/31/2019 CLINICAL DATA:  Respiratory failure. EXAM: PORTABLE CHEST 1 VIEW COMPARISON:  08/27/2017 FINDINGS: Tracheostomy tube tip at the thoracic inlet. Cardiomegaly which appears similar to prior exam. Tortuous thoracic aorta. Hazy opacity at the right lung base consistent with pleural effusion and atelectasis/airspace disease. Vascular congestion without overt edema. No pneumothorax. Chronic change about both shoulders.  IMPRESSION: 1. Hazy opacity at the right lung base consistent with pleural effusion and atelectasis/airspace disease. Chronic cardiomegaly. Vascular congestion. 2. Tracheostomy tube tip at the thoracic inlet. Electronically Signed   By: Keith Rake M.D.   On: 07/31/2019 00:38        Scheduled Meds: . acidophilus  1 capsule Oral BID  . carvedilol  12.5 mg Per Tube BID  . chlorhexidine gluconate (MEDLINE KIT)  15 mL Mouth Rinse BID  . Chlorhexidine Gluconate Cloth  6 each Topical Q0600  . cholecalciferol  2,000 Units Per Tube Daily  . diltiazem  30 mg Per Tube BID  . ferrous sulfate  325 mg Oral Q breakfast  . folic acid  1 mg Per Tube Daily  . levothyroxine  100 mcg Per Tube QAC breakfast  . loratadine  10 mg Per Tube Daily  . mouth rinse  15 mL Mouth Rinse 10 times per day  . Melatonin  1 tablet Per Tube QHS  . multivitamin with minerals  1 tablet Per Tube Daily  . mupirocin ointment  1 application Nasal BID   Continuous Infusions: . sodium chloride    . pantoprozole (PROTONIX) infusion 8 mg/hr (07/31/19 1000)     LOS: 0 days    Time spent: 35 mins.More than 50% of that time was spent in counseling and/or coordination of care.      Shelly Coss, MD [facility] Pager [phone]  If 7PM-7AM, please contact night-coverage [email or web address] Password [email or web address] 07/31/2019, 11:04 AM

## 2019-07-31 NOTE — Progress Notes (Signed)
CSW spoke with Erline Levine at [facility] to confirm that this patient can return once medically ready, Erline Levine stated yes. Patient is from [facility].  Per RN at rounds, patient will likely be ready for discharge tomorrow.  CSW to continue following.  Madilyn Fireman, MSW, LCSW-A Clinical Social Worker [facility] [phone]

## 2019-08-01 DIAGNOSIS — Z7901 Long term (current) use of anticoagulants: Secondary | ICD-10-CM | POA: Diagnosis not present

## 2019-08-01 DIAGNOSIS — N179 Acute kidney failure, unspecified: Secondary | ICD-10-CM | POA: Diagnosis present

## 2019-08-01 DIAGNOSIS — K922 Gastrointestinal hemorrhage, unspecified: Secondary | ICD-10-CM | POA: Diagnosis present

## 2019-08-01 DIAGNOSIS — Z79899 Other long term (current) drug therapy: Secondary | ICD-10-CM | POA: Diagnosis not present

## 2019-08-01 DIAGNOSIS — D62 Acute posthemorrhagic anemia: Secondary | ICD-10-CM | POA: Diagnosis present

## 2019-08-01 DIAGNOSIS — Z930 Tracheostomy status: Secondary | ICD-10-CM | POA: Diagnosis not present

## 2019-08-01 DIAGNOSIS — N184 Chronic kidney disease, stage 4 (severe): Secondary | ICD-10-CM | POA: Diagnosis present

## 2019-08-01 DIAGNOSIS — E46 Unspecified protein-calorie malnutrition: Secondary | ICD-10-CM | POA: Diagnosis present

## 2019-08-01 DIAGNOSIS — Z86718 Personal history of other venous thrombosis and embolism: Secondary | ICD-10-CM | POA: Diagnosis not present

## 2019-08-01 DIAGNOSIS — Z881 Allergy status to other antibiotic agents status: Secondary | ICD-10-CM | POA: Diagnosis not present

## 2019-08-01 DIAGNOSIS — I1310 Hypertensive heart and chronic kidney disease without heart failure, with stage 1 through stage 4 chronic kidney disease, or unspecified chronic kidney disease: Secondary | ICD-10-CM | POA: Diagnosis present

## 2019-08-01 DIAGNOSIS — I482 Chronic atrial fibrillation, unspecified: Secondary | ICD-10-CM | POA: Diagnosis present

## 2019-08-01 DIAGNOSIS — K921 Melena: Secondary | ICD-10-CM | POA: Diagnosis present

## 2019-08-01 DIAGNOSIS — Z9911 Dependence on respirator [ventilator] status: Secondary | ICD-10-CM | POA: Diagnosis not present

## 2019-08-01 DIAGNOSIS — D638 Anemia in other chronic diseases classified elsewhere: Secondary | ICD-10-CM | POA: Diagnosis present

## 2019-08-01 DIAGNOSIS — J9610 Chronic respiratory failure, unspecified whether with hypoxia or hypercapnia: Secondary | ICD-10-CM | POA: Diagnosis present

## 2019-08-01 DIAGNOSIS — Z6841 Body Mass Index (BMI) 40.0 and over, adult: Secondary | ICD-10-CM | POA: Diagnosis not present

## 2019-08-01 DIAGNOSIS — J302 Other seasonal allergic rhinitis: Secondary | ICD-10-CM | POA: Diagnosis present

## 2019-08-01 DIAGNOSIS — I5032 Chronic diastolic (congestive) heart failure: Secondary | ICD-10-CM | POA: Diagnosis present

## 2019-08-01 DIAGNOSIS — Z20828 Contact with and (suspected) exposure to other viral communicable diseases: Secondary | ICD-10-CM | POA: Diagnosis present

## 2019-08-01 DIAGNOSIS — Z885 Allergy status to narcotic agent status: Secondary | ICD-10-CM | POA: Diagnosis not present

## 2019-08-01 DIAGNOSIS — E785 Hyperlipidemia, unspecified: Secondary | ICD-10-CM | POA: Diagnosis present

## 2019-08-01 DIAGNOSIS — E1122 Type 2 diabetes mellitus with diabetic chronic kidney disease: Secondary | ICD-10-CM | POA: Diagnosis present

## 2019-08-01 DIAGNOSIS — D649 Anemia, unspecified: Secondary | ICD-10-CM | POA: Diagnosis not present

## 2019-08-01 DIAGNOSIS — E039 Hypothyroidism, unspecified: Secondary | ICD-10-CM | POA: Diagnosis present

## 2019-08-01 DIAGNOSIS — R1310 Dysphagia, unspecified: Secondary | ICD-10-CM | POA: Diagnosis present

## 2019-08-01 LAB — CBC WITH DIFFERENTIAL/PLATELET
Abs Immature Granulocytes: 0.03 10*3/uL (ref 0.00–0.07)
Basophils Absolute: 0 10*3/uL (ref 0.0–0.1)
Basophils Relative: 0 %
Eosinophils Absolute: 0.5 10*3/uL (ref 0.0–0.5)
Eosinophils Relative: 6 %
HCT: 23.2 % — ABNORMAL LOW (ref 36.0–46.0)
Hemoglobin: 7.5 g/dL — ABNORMAL LOW (ref 12.0–15.0)
Immature Granulocytes: 0 %
Lymphocytes Relative: 17 %
Lymphs Abs: 1.4 10*3/uL (ref 0.7–4.0)
MCH: 28.2 pg (ref 26.0–34.0)
MCHC: 32.3 g/dL (ref 30.0–36.0)
MCV: 87.2 fL (ref 80.0–100.0)
Monocytes Absolute: 1 10*3/uL (ref 0.1–1.0)
Monocytes Relative: 12 %
Neutro Abs: 5.3 10*3/uL (ref 1.7–7.7)
Neutrophils Relative %: 65 %
Platelets: 198 10*3/uL (ref 150–400)
RBC: 2.66 MIL/uL — ABNORMAL LOW (ref 3.87–5.11)
RDW: 17.5 % — ABNORMAL HIGH (ref 11.5–15.5)
WBC: 8.1 10*3/uL (ref 4.0–10.5)
nRBC: 0 % (ref 0.0–0.2)

## 2019-08-01 LAB — GLUCOSE, CAPILLARY
Glucose-Capillary: 85 mg/dL (ref 70–99)
Glucose-Capillary: 93 mg/dL (ref 70–99)
Glucose-Capillary: 89 mg/dL (ref 70–99)
Glucose-Capillary: 91 mg/dL (ref 70–99)

## 2019-08-01 LAB — BASIC METABOLIC PANEL
Anion gap: 11 (ref 5–15)
BUN: 83 mg/dL — ABNORMAL HIGH (ref 8–23)
CO2: 24 mmol/L (ref 22–32)
Calcium: 9.3 mg/dL (ref 8.9–10.3)
Chloride: 110 mmol/L (ref 98–111)
Creatinine, Ser: 3.41 mg/dL — ABNORMAL HIGH (ref 0.44–1.00)
GFR calc Af Amer: 14 mL/min — ABNORMAL LOW (ref >60)
GFR calc non Af Amer: 12 mL/min — ABNORMAL LOW (ref >60)
Glucose, Bld: 81 mg/dL (ref 70–99)
Potassium: 3.7 mmol/L (ref 3.5–5.1)
Sodium: 145 mmol/L (ref 135–145)

## 2019-08-01 MED ORDER — BACID PO TABS
2.0000 | ORAL_TABLET | Freq: Two times a day (BID) | ORAL | Status: DC
  Filled 2019-08-01: qty 2

## 2019-08-01 MED ORDER — FERROUS SULFATE 300 (60 FE) MG/5ML PO SYRP
300.0000 mg | ORAL_SOLUTION | Freq: Every day | ORAL | Status: DC
  Filled 2019-08-01: qty 5

## 2019-08-01 MED ORDER — PANTOPRAZOLE SODIUM 40 MG PO TBEC: 40.0000 mg | DELAYED_RELEASE_TABLET | Freq: Two times a day (BID) | ORAL | Status: AC

## 2019-08-01 NOTE — Progress Notes (Signed)
HISTORY OF PRESENT ILLNESS:  Grey Ignacio is a 78 y.o. female with a history of chronic recurrent GI bleeding for which she is undergone multiple evaluations elsewhere.  Seen yesterday after transfer from long-term care facility regarding bleeding (melena) in the face of chronic anticoagulation therapy (Eliquis) for history of atrial fibrillation and DVT.  She does have a filter.  Seen by GI team yesterday.  She has been stable.  Hemoglobin stable at 7.5.  No bowel movements per nursing.  I am told that the plan is to hold her Eliquis and send her back to the long-term care facility.  REVIEW OF SYSTEMS:  All non-GI ROS noncontributory  Past Medical History:  Diagnosis Date  . Anemia   . Atrial fibrillation ([place])   . Chronic kidney disease   . Chronic respiratory failure ([place])   . Diabetes mellitus without complication ([place])   . DVT (deep venous thrombosis) ([place])   . Gastrointestinal bleed   . Morbid obesity ([place])   . 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 Thyroid disease       Social History Sawda Badalamenti  reports that she has never smoked. She has never used smokeless tobacco. She reports previous alcohol use. She reports previous drug use.  family history is not on file.  Allergies  Allergen Reactions  . Clarithromycin Itching and Rash  . Codeine Itching    Other reaction(s): Unknown  . Latex Hives    Other reaction(s): Other Blisters   . Other     adhesive on tape       PHYSICAL EXAMINATION: Vital signs: BP 124/83   Pulse 81   Temp 98.6 F (37 C) (Oral)   Resp 16   Ht 5\' 1"  (1.549 m)   Wt 89.7 kg   SpO2 100%   BMI 37.37 kg/m   Constitutional: Chronically ill, somnolent, on ventilator Abdomen: Not reexamined  ASSESSMENT:  1.  GI bleed in the face of chronic anticoagulation therapy.  Extensive GI work-ups elsewhere regarding the same.  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  I understand the plan is to hold anticoagulation  therapy.  This may be enough to prevent clinically significant bleeding in the future.  Her hemoglobin has improved after transfusion without evidence of recurrent bleeding.  Supportive care and conservative therapy recommended.  No further recommendations from GI standpoint, but we are available if needed for questions or problems.  Docia Chuck. Geri Seminole., M.D. [facility] Division of Gastroenterology

## 2019-08-01 NOTE — Discharge Summary (Signed)
Physician Discharge Summary  Shirley Malone [id number] DOB: 1941/10/08 DOA: 07/30/2019  PCP: Townsend Roger, MD  Admit date: 07/30/2019 Discharge date: 08/01/2019  Admitted From: Home Disposition:  Home  Discharge Condition:Stable CODE STATUS:FULL Diet recommendation: Tube feeding  Brief/Interim Summary:  Patient is a 78 year old female, long-term resident at [facility], with history of chronic respiratory failure status post trach on vent, OHS, morbid obesity, GI bleed, DVT, A. fib on Eliquis, anemia, CKD stage IV, diabetes mellitus who presented here with complaints of dark stool and hemoglobin of 6.3.  Patient was transfused with 2 units of PRBC.  Hemoglobin this morning stable.  GI consulted  for evaluation of GI bleed. No plan for endoscopy.  AVM bleeding suspected.  GI recommended to hold Eliquis. Currently hemodynamically stable.  She is stable for discharge back to [facility] today.  Following problems were addressed during her hospitalization:  Suspected upper GI bleed/acute blood loss anemia: Currently hemodynamically stable.  She was on Eliquis for A. fib and DVT.  Transfused with 2 units of   PRBC.  This morning hemoglobin stable .  Suspected AVMs. No active bleeding. Continue protonix BID.  Chronic normocytic anemia: Continue iron supplementation.  AKI on CKD stage IV: Baseline creatinine around 3.  Her creatinine on 6/20 was 2.3.  Please check BMP in  A week.  Chronic respiratory failure: Status post trach, currently on vent.  PCCM managing vent.  Respiratory status stable  Chronic A. fib/DVT: On Eliquis.  Recommended to stop . Her PCP can decide on stopping Eliquis permanently.Rate well controlled.  On Cardizem  History of diabetes mellitus type 2: Currently on sliding   Discharge Diagnoses:  Active Problems:   Acute blood loss anemia   Respiratory failure (HCC)   Symptomatic anemia   Anticoagulated    Discharge Instructions  Discharge Instructions     Diet general   Complete by: As directed    Tube feeding   Discharge instructions   Complete by: As directed    1)Please check CBC and BMP tests in a week. 2)We recommend to stop taking Eliquis.   Increase activity slowly   Complete by: As directed      Allergies as of 08/01/2019      Reactions   Clarithromycin Itching, Rash   Codeine Itching   Other reaction(s): Unknown   Latex Hives   Other reaction(s): Other Blisters   Other    adhesive on tape      Medication List    STOP taking these medications   Eliquis 5 MG Tabs tablet Generic drug: apixaban     TAKE these medications   ACIDOPHILUS LACTOBACILLUS PO Take 1 tablet by mouth 2 (two) times daily.   carvedilol 12.5 MG tablet Commonly known as: COREG Take 12.5 mg by mouth 2 (two) times daily.   cetirizine 10 MG tablet Commonly known as: ZYRTEC Take 10 mg by mouth daily.   chlorhexidine 0.12 % solution Commonly known as: PERIDEX Use as directed 15 mLs in the mouth or throat 2 (two) times daily.   Darbepoetin Alfa 25 MCG/0.42ML Sosy injection Commonly known as: ARANESP Inject 25 mcg into the skin every 7 (seven) days.   diltiazem 30 MG tablet Commonly known as: CARDIZEM Take 30 mg by mouth 2 (two) times daily.   feeding supplement (PRO-STAT SUGAR FREE 64) Liqd Take 30 mLs by mouth every 12 (twelve) hours.   ferrous sulfate 325 (65 FE) MG tablet Take 325 mg by mouth daily with breakfast.   folic  acid 1 MG tablet Commonly known as: FOLVITE Take 1 mg by mouth daily.   levothyroxine 100 MCG tablet Commonly known as: SYNTHROID Take 100 mcg by mouth daily before breakfast.   loperamide 2 MG tablet Commonly known as: IMODIUM A-D Take 2 mg by mouth daily.   magnesium oxide 400 MG tablet Commonly known as: MAG-OX Take 400 mg by mouth every 12 (twelve) hours.   Melatonin 3 MG Tabs Take 1 tablet by mouth at bedtime.   multivitamin with minerals Tabs tablet Take 1 tablet by mouth daily.    pantoprazole 40 MG tablet Commonly known as: PROTONIX Take 1 tablet (40 mg total) by mouth 2 (two) times daily. What changed: when to take this   phenol 1.4 % Liqd Commonly known as: CHLORASEPTIC Use as directed 1 spray in the mouth or throat every 12 (twelve) hours.   sertraline 100 MG tablet Commonly known as: ZOLOFT Take 100 mg by mouth daily.   Vitamin D3 50 MCG (2000 UT) Tabs Take 1 tablet by mouth daily.      Follow-up Information    Nona Dell, Corene Cornea, MD. Schedule an appointment as soon as possible for a visit in 1 week(s).   Specialty: Internal Medicine Contact information: [address] [place] [address] [phone]          Allergies  Allergen Reactions  . Clarithromycin Itching and Rash  . Codeine Itching    Other reaction(s): Unknown  . Latex Hives    Other reaction(s): Other Blisters   . Other     adhesive on tape    Consultations:  GI   Procedures/Studies: Dg Chest Port 1 View  Result Date: 07/31/2019 CLINICAL DATA:  Respiratory failure. EXAM: PORTABLE CHEST 1 VIEW COMPARISON:  08/27/2017 FINDINGS: Tracheostomy tube tip at the thoracic inlet. Cardiomegaly which appears similar to prior exam. Tortuous thoracic aorta. Hazy opacity at the right lung base consistent with pleural effusion and atelectasis/airspace disease. Vascular congestion without overt edema. No pneumothorax. Chronic change about both shoulders. IMPRESSION: 1. Hazy opacity at the right lung base consistent with pleural effusion and atelectasis/airspace disease. Chronic cardiomegaly. Vascular congestion. 2. Tracheostomy tube tip at the thoracic inlet. Electronically Signed   By: Keith Rake M.D.   On: 07/31/2019 00:38       Subjective: Patient seen and examined the bedside this morning.  Hemodynamically stable.  Comfortable.  Alert and awake.  No active bleeding.  I called the son to give an update but he did not receive the phone.  Stable for discharge back to [facility]  today.  Discharge Exam: Vitals:   08/01/19 1107 08/01/19 1200  BP: 124/83   Pulse: 85 81  Resp: 16 16  Temp: 98.6 F (37 C)   SpO2: 100% 100%   Vitals:   08/01/19 1000 08/01/19 1100 08/01/19 1107 08/01/19 1200  BP: (!) 130/107 124/83 124/83   Pulse: 82 75 85 81  Resp: (!) 24 17 16 16   Temp:   98.6 F (37 C)   TempSrc:   Oral   SpO2: 100% 100% 100% 100%  Weight:      Height:        General: Pt is alert, awake, not in acute distress,obese Cardiovascular: RRR, S1/S2 +, no rubs, no gallops Respiratory: CTA bilaterally, no wheezing, no rhonchi,Trach Abdominal: Soft, NT, ND, bowel sounds + Extremities: no edema, no cyanosis    The results of significant diagnostics from this hospitalization (including imaging, microbiology, ancillary and laboratory) are  listed below for reference.     Microbiology: Recent Results (from the past 240 hour(s))  SARS Coronavirus 2 [facility] order, Performed in [facility] hospital lab) Nasopharyngeal Nasopharyngeal Swab     Status: None   Collection Time: 07/30/19  6:18 PM   Specimen: Nasopharyngeal Swab  Result Value Ref Range Status   SARS Coronavirus 2 NEGATIVE NEGATIVE Final    Comment: (NOTE) If result is NEGATIVE SARS-CoV-2 target nucleic acids are NOT DETECTED. The SARS-CoV-2 RNA is generally detectable in upper and lower  respiratory specimens during the acute phase of infection. The lowest  concentration of SARS-CoV-2 viral copies this assay can detect is 250  copies / mL. A negative result does not preclude SARS-CoV-2 infection  and should not be used as the sole basis for treatment or other  patient management decisions.  A negative result may occur with  improper specimen collection / handling, submission of specimen other  than nasopharyngeal swab, presence of viral mutation(s) within the  areas targeted by this assay, and inadequate number of viral copies  (<250 copies / mL). A negative result must be combined with clinical   observations, patient history, and epidemiological information. If result is POSITIVE SARS-CoV-2 target nucleic acids are DETECTED. The SARS-CoV-2 RNA is generally detectable in upper and lower  respiratory specimens dur ing the acute phase of infection.  Positive  results are indicative of active infection with SARS-CoV-2.  Clinical  correlation with patient history and other diagnostic information is  necessary to determine patient infection status.  Positive results do  not rule out bacterial infection or co-infection with other viruses. If result is PRESUMPTIVE POSTIVE SARS-CoV-2 nucleic acids MAY BE PRESENT.   A presumptive positive result was obtained on the submitted specimen  and confirmed on repeat testing.  While 2019 novel coronavirus  (SARS-CoV-2) nucleic acids may be present in the submitted sample  additional confirmatory testing may be necessary for epidemiological  and / or clinical management purposes  to differentiate between  SARS-CoV-2 and other Sarbecovirus currently known to infect humans.  If clinically indicated additional testing with an alternate test  methodology [phone]) is advised. The SARS-CoV-2 RNA is generally  detectable in upper and lower respiratory sp ecimens during the acute  phase of infection. The expected result is Negative. Fact Sheet for Patients:  StrictlyIdeas.no Fact Sheet for Healthcare Providers: BankingDealers.co.za This test is not yet approved or cleared by the [place] FDA and has been authorized for detection and/or diagnosis of SARS-CoV-2 by FDA under an Emergency Use Authorization (EUA).  This EUA will remain in effect (meaning this test can be used) for the duration of the COVID-19 declaration under Section 564(b)(1) of the Act, 21 U.S.C. section 360bbb-3(b)(1), unless the authorization is terminated or revoked sooner. Performed at [facility], [place] [address]  [place], [place] [address]   MRSA PCR Screening     Status: Abnormal   Collection Time: 07/30/19 10:14 PM   Specimen: Nasopharyngeal  Result Value Ref Range Status   MRSA by PCR POSITIVE (A) NEGATIVE Final    Comment:        The GeneXpert MRSA Assay (FDA approved for NASAL specimens only), is one component of a comprehensive MRSA colonization surveillance program. It is not intended to diagnose MRSA infection nor to guide or monitor treatment for MRSA infections. RESULT CALLED TO, READ BACK BY AND VERIFIED WITH: [facility] RN [id number] 07/31/2019 MITCHELL,L Performed at [facility], [place] [address] [place], [place]  [address]   Culture, respiratory (non-expectorated)     Status: None (Preliminary result)   Collection Time: 07/31/19  4:09 AM   Specimen: Tracheal Aspirate; Respiratory  Result Value Ref Range Status   Specimen Description TRACHEAL ASPIRATE  Final   Special Requests NONE  Final   Gram Stain   Final    ABUNDANT WBC PRESENT, PREDOMINANTLY PMN MODERATE GRAM POSITIVE RODS MODERATE GRAM NEGATIVE RODS RARE GRAM POSITIVE COCCI    Culture   Final    CULTURE REINCUBATED FOR BETTER GROWTH Performed at [facility], [address], [place], [place] [address]    Report Status PENDING  Incomplete     Labs: BNP (last 3 results) No results for input(s): BNP in the last 8760 hours. Basic Metabolic Panel: Recent Labs  Lab 07/30/19 1614 07/31/19 0245 08/01/19 0248  NA 143 143 145  K 4.6 4.2 3.7  CL 104 107 110  CO2 27 24 24   GLUCOSE 105* 85 81  BUN 97* 93* 83*  CREATININE 3.51* 3.38* 3.41*  CALCIUM 9.6 9.3 9.3  MG  --  2.3  --   PHOS  --  3.5  --    Liver Function Tests: Recent Labs  Lab 07/30/19 1614  AST 24  ALT 12  ALKPHOS 355*  BILITOT 0.7  PROT 7.3  ALBUMIN 2.5*   No results for input(s): LIPASE, AMYLASE in the last 168 hours. No results for input(s): AMMONIA in the last 168 hours. CBC: Recent Labs  Lab 07/30/19 1614 07/31/19 0245  07/31/19 0959 08/01/19 0248  WBC 10.4 8.4  --  8.1  NEUTROABS  --   --   --  5.3  HGB 6.3* 7.6* 8.1* 7.5*  HCT 20.9* 23.5* 25.5* 23.2*  MCV 86.7 86.1  --  87.2  PLT 237 240  --  198   Cardiac Enzymes: No results for input(s): CKTOTAL, CKMB, CKMBINDEX, TROPONINI in the last 168 hours. BNP: Invalid input(s): POCBNP CBG: Recent Labs  Lab 07/31/19 1912 07/31/19 2320 08/01/19 0314 08/01/19 0723 08/01/19 1122  GLUCAP 67* 91 85 93 89   D-Dimer No results for input(s): DDIMER in the last 72 hours. Hgb A1c No results for input(s): HGBA1C in the last 72 hours. Lipid Profile No results for input(s): CHOL, HDL, LDLCALC, TRIG, CHOLHDL, LDLDIRECT in the last 72 hours. Thyroid function studies Recent Labs    07/31/19 0245  TSH 1.194   Anemia work up No results for input(s): VITAMINB12, FOLATE, FERRITIN, TIBC, IRON, RETICCTPCT in the last 72 hours. Urinalysis    Component Value Date/Time   COLORURINE YELLOW 07/30/2019 2313   APPEARANCEUR CLEAR 07/30/2019 2313   LABSPEC 1.011 07/30/2019 2313   PHURINE 9.0 (H) 07/30/2019 2313   GLUCOSEU NEGATIVE 07/30/2019 2313   HGBUR NEGATIVE 07/30/2019 2313   BILIRUBINUR NEGATIVE 07/30/2019 2313   KETONESUR NEGATIVE 07/30/2019 2313   PROTEINUR NEGATIVE 07/30/2019 2313   NITRITE NEGATIVE 07/30/2019 2313   LEUKOCYTESUR NEGATIVE 07/30/2019 2313   Sepsis Labs Invalid input(s): PROCALCITONIN,  WBC,  LACTICIDVEN Microbiology Recent Results (from the past 240 hour(s))  SARS Coronavirus 2 [facility] order, Performed in [facility] hospital lab) Nasopharyngeal Nasopharyngeal Swab     Status: None   Collection Time: 07/30/19  6:18 PM   Specimen: Nasopharyngeal Swab  Result Value Ref Range Status   SARS Coronavirus 2 NEGATIVE NEGATIVE Final    Comment: (NOTE) If result is NEGATIVE SARS-CoV-2 target nucleic acids are NOT DETECTED. The SARS-CoV-2 RNA is generally detectable in upper and  lower  respiratory specimens during the acute phase of  infection. The lowest  concentration of SARS-CoV-2 viral copies this assay can detect is 250  copies / mL. A negative result does not preclude SARS-CoV-2 infection  and should not be used as the sole basis for treatment or other  patient management decisions.  A negative result may occur with  improper specimen collection / handling, submission of specimen other  than nasopharyngeal swab, presence of viral mutation(s) within the  areas targeted by this assay, and inadequate number of viral copies  (<250 copies / mL). A negative result must be combined with clinical  observations, patient history, and epidemiological information. If result is POSITIVE SARS-CoV-2 target nucleic acids are DETECTED. The SARS-CoV-2 RNA is generally detectable in upper and lower  respiratory specimens dur ing the acute phase of infection.  Positive  results are indicative of active infection with SARS-CoV-2.  Clinical  correlation with patient history and other diagnostic information is  necessary to determine patient infection status.  Positive results do  not rule out bacterial infection or co-infection with other viruses. If result is PRESUMPTIVE POSTIVE SARS-CoV-2 nucleic acids MAY BE PRESENT.   A presumptive positive result was obtained on the submitted specimen  and confirmed on repeat testing.  While 2019 novel coronavirus  (SARS-CoV-2) nucleic acids may be present in the submitted sample  additional confirmatory testing may be necessary for epidemiological  and / or clinical management purposes  to differentiate between  SARS-CoV-2 and other Sarbecovirus currently known to infect humans.  If clinically indicated additional testing with an alternate test  methodology [phone]) is advised. The SARS-CoV-2 RNA is generally  detectable in upper and lower respiratory sp ecimens during the acute  phase of infection. The expected result is Negative. Fact Sheet for Patients:   StrictlyIdeas.no Fact Sheet for Healthcare Providers: BankingDealers.co.za This test is not yet approved or cleared by the [place] FDA and has been authorized for detection and/or diagnosis of SARS-CoV-2 by FDA under an Emergency Use Authorization (EUA).  This EUA will remain in effect (meaning this test can be used) for the duration of the COVID-19 declaration under Section 564(b)(1) of the Act, 21 U.S.C. section 360bbb-3(b)(1), unless the authorization is terminated or revoked sooner. Performed at [facility], [place] [address] [place], [place] [address]   MRSA PCR Screening     Status: Abnormal   Collection Time: 07/30/19 10:14 PM   Specimen: Nasopharyngeal  Result Value Ref Range Status   MRSA by PCR POSITIVE (A) NEGATIVE Final    Comment:        The GeneXpert MRSA Assay (FDA approved for NASAL specimens only), is one component of a comprehensive MRSA colonization surveillance program. It is not intended to diagnose MRSA infection nor to guide or monitor treatment for MRSA infections. RESULT CALLED TO, READ BACK BY AND VERIFIED WITH: [facility] RN 0021 07/31/2019 MITCHELL,L Performed at [facility], [place] [address], [place], [place] [address]   Culture, respiratory (non-expectorated)     Status: None (Preliminary result)   Collection Time: 07/31/19  4:09 AM   Specimen: Tracheal Aspirate; Respiratory  Result Value Ref Range Status   Specimen Description TRACHEAL ASPIRATE  Final   Special Requests NONE  Final   Gram Stain   Final    ABUNDANT WBC PRESENT, PREDOMINANTLY PMN MODERATE GRAM POSITIVE RODS MODERATE GRAM NEGATIVE RODS RARE GRAM POSITIVE COCCI    Culture   Final    CULTURE REINCUBATED FOR  BETTER GROWTH Performed at [facility], [place] [address] [place], [place] [address]    Report Status PENDING  Incomplete    Please note: You were cared for by a hospitalist during your hospital stay. Once  you are discharged, your primary care physician will handle any further medical issues. Please note that NO REFILLS for any discharge medications will be authorized once you are discharged, as it is imperative that you return to your primary care physician (or establish a relationship with a primary care physician if you do not have one) for your post hospital discharge needs so that they can reassess your need for medications and monitor your lab values.    Time coordinating discharge: 40 minutes  SIGNED:   Shelly Coss, MD  [facility] 08/01/2019, 1:38 PM Pager [phone]  If 7PM-7AM, please contact night-coverage [email or web address] Password [email or web address]

## 2019-08-01 NOTE — TOC Transition Note (Addendum)
Transition of Care [facility]) - CM/SW Discharge Note   Patient Details  Name: Addelyn Littig MRN: [id number] Date of Birth: [birth year]  Transition of Care [facility]) CM/SW Contact:  Archie Endo, LCSW Phone Number: 08/01/2019, 11:55 AM   Clinical Narrative:    CSW spoke with patient's son Cornelia Copa to inform him of the discharge plan of returning to [facility].  Patient will go to room 307. Patient will go to [facility] via [facility]. The number to call for report is [phone]. Accepting physician is Dr. Nona Dell. [facility] stated that they should arrive at [facility] around "supper time" but would not provided a specific time, CSW notified RN.  Please fax patient's discharge summary and negative COVID to [phone] prior to discharge.    Barriers to Discharge: No Barriers Identified   Patient Goals and CMS Choice        Discharge Placement              Patient chooses bed at: [facility] Patient to be transferred to facility by: [facility] Name of family member notified: Cornelia Copa, son Patient and family notified of of transfer: 08/01/19  Discharge Plan and Services      Social Determinants of Health (SDOH) Interventions     Readmission Risk Interventions No flowsheet data found.

## 2019-08-02 LAB — CULTURE, RESPIRATORY W GRAM STAIN: Culture: NORMAL

## 2019-08-15 ENCOUNTER — Emergency Department (HOSPITAL_COMMUNITY): Payer: Medicare Other

## 2019-08-15 ENCOUNTER — Other Ambulatory Visit: Payer: Self-pay

## 2019-08-15 ENCOUNTER — Encounter (HOSPITAL_COMMUNITY): Payer: Self-pay | Admitting: Emergency Medicine

## 2019-08-15 ENCOUNTER — Inpatient Hospital Stay (HOSPITAL_COMMUNITY)
Admission: EM | Admit: 2019-08-15 | Discharge: 2019-08-20 | DRG: 683 | Disposition: A | Discharge status: Skilled Nursing Facility | Payer: Medicare Other | Source: Skilled Nursing Facility | Attending: Internal Medicine | Admitting: Internal Medicine

## 2019-08-15 DIAGNOSIS — E860 Dehydration: Secondary | ICD-10-CM | POA: Diagnosis present

## 2019-08-15 DIAGNOSIS — D649 Anemia, unspecified: Secondary | ICD-10-CM | POA: Diagnosis not present

## 2019-08-15 DIAGNOSIS — Z7901 Long term (current) use of anticoagulants: Secondary | ICD-10-CM | POA: Diagnosis not present

## 2019-08-15 DIAGNOSIS — Z91040 Latex allergy status: Secondary | ICD-10-CM

## 2019-08-15 DIAGNOSIS — Z881 Allergy status to other antibiotic agents status: Secondary | ICD-10-CM

## 2019-08-15 DIAGNOSIS — I959 Hypotension, unspecified: Secondary | ICD-10-CM | POA: Diagnosis not present

## 2019-08-15 DIAGNOSIS — Z79890 Hormone replacement therapy (postmenopausal): Secondary | ICD-10-CM

## 2019-08-15 DIAGNOSIS — J9610 Chronic respiratory failure, unspecified whether with hypoxia or hypercapnia: Secondary | ICD-10-CM

## 2019-08-15 DIAGNOSIS — R451 Restlessness and agitation: Secondary | ICD-10-CM | POA: Diagnosis present

## 2019-08-15 DIAGNOSIS — J9611 Chronic respiratory failure with hypoxia: Secondary | ICD-10-CM | POA: Diagnosis present

## 2019-08-15 DIAGNOSIS — I130 Hypertensive heart and chronic kidney disease with heart failure and stage 1 through stage 4 chronic kidney disease, or unspecified chronic kidney disease: Secondary | ICD-10-CM | POA: Diagnosis not present

## 2019-08-15 DIAGNOSIS — L89152 Pressure ulcer of sacral region, stage 2: Secondary | ICD-10-CM | POA: Diagnosis present

## 2019-08-15 DIAGNOSIS — Z8701 Personal history of pneumonia (recurrent): Secondary | ICD-10-CM

## 2019-08-15 DIAGNOSIS — N184 Chronic kidney disease, stage 4 (severe): Secondary | ICD-10-CM | POA: Diagnosis not present

## 2019-08-15 DIAGNOSIS — I480 Paroxysmal atrial fibrillation: Secondary | ICD-10-CM | POA: Diagnosis not present

## 2019-08-15 DIAGNOSIS — I5032 Chronic diastolic (congestive) heart failure: Secondary | ICD-10-CM | POA: Diagnosis not present

## 2019-08-15 DIAGNOSIS — K529 Noninfective gastroenteritis and colitis, unspecified: Secondary | ICD-10-CM | POA: Diagnosis present

## 2019-08-15 DIAGNOSIS — N179 Acute kidney failure, unspecified: Principal | ICD-10-CM | POA: Diagnosis present

## 2019-08-15 DIAGNOSIS — E039 Hypothyroidism, unspecified: Secondary | ICD-10-CM | POA: Diagnosis present

## 2019-08-15 DIAGNOSIS — E875 Hyperkalemia: Secondary | ICD-10-CM | POA: Diagnosis present

## 2019-08-15 DIAGNOSIS — E662 Morbid (severe) obesity with alveolar hypoventilation: Secondary | ICD-10-CM | POA: Diagnosis present

## 2019-08-15 DIAGNOSIS — Z6835 Body mass index (BMI) 35.0-35.9, adult: Secondary | ICD-10-CM

## 2019-08-15 DIAGNOSIS — Z930 Tracheostomy status: Secondary | ICD-10-CM | POA: Diagnosis not present

## 2019-08-15 DIAGNOSIS — B351 Tinea unguium: Secondary | ICD-10-CM | POA: Diagnosis present

## 2019-08-15 DIAGNOSIS — Z885 Allergy status to narcotic agent status: Secondary | ICD-10-CM

## 2019-08-15 DIAGNOSIS — Z20828 Contact with and (suspected) exposure to other viral communicable diseases: Secondary | ICD-10-CM | POA: Diagnosis present

## 2019-08-15 DIAGNOSIS — Z931 Gastrostomy status: Secondary | ICD-10-CM

## 2019-08-15 DIAGNOSIS — K219 Gastro-esophageal reflux disease without esophagitis: Secondary | ICD-10-CM | POA: Diagnosis not present

## 2019-08-15 DIAGNOSIS — E861 Hypovolemia: Secondary | ICD-10-CM

## 2019-08-15 DIAGNOSIS — Z79899 Other long term (current) drug therapy: Secondary | ICD-10-CM

## 2019-08-15 DIAGNOSIS — Z87440 Personal history of urinary (tract) infections: Secondary | ICD-10-CM

## 2019-08-15 DIAGNOSIS — L8990 Pressure ulcer of unspecified site, unspecified stage: Secondary | ICD-10-CM | POA: Insufficient documentation

## 2019-08-15 DIAGNOSIS — Z86718 Personal history of other venous thrombosis and embolism: Secondary | ICD-10-CM

## 2019-08-15 DIAGNOSIS — K922 Gastrointestinal hemorrhage, unspecified: Secondary | ICD-10-CM

## 2019-08-15 DIAGNOSIS — E1122 Type 2 diabetes mellitus with diabetic chronic kidney disease: Secondary | ICD-10-CM | POA: Diagnosis not present

## 2019-08-15 DIAGNOSIS — Z91048 Other nonmedicinal substance allergy status: Secondary | ICD-10-CM

## 2019-08-15 LAB — BASIC METABOLIC PANEL
Anion gap: 12 (ref 5–15)
BUN: 121 mg/dL — ABNORMAL HIGH (ref 8–23)
CO2: 23 mmol/L (ref 22–32)
Calcium: 9.1 mg/dL (ref 8.9–10.3)
Chloride: 100 mmol/L (ref 98–111)
Creatinine, Ser: 4.36 mg/dL — ABNORMAL HIGH (ref 0.44–1.00)
GFR calc Af Amer: 11 mL/min — ABNORMAL LOW (ref >60)
GFR calc non Af Amer: 9 mL/min — ABNORMAL LOW (ref >60)
Glucose, Bld: 102 mg/dL — ABNORMAL HIGH (ref 70–99)
Potassium: 5.4 mmol/L — ABNORMAL HIGH (ref 3.5–5.1)
Sodium: 135 mmol/L (ref 135–145)

## 2019-08-15 LAB — CBC
HCT: 23.5 % — ABNORMAL LOW (ref 36.0–46.0)
Hemoglobin: 7.3 g/dL — ABNORMAL LOW (ref 12.0–15.0)
MCH: 28 pg (ref 26.0–34.0)
MCHC: 31.1 g/dL (ref 30.0–36.0)
MCV: 90 fL (ref 80.0–100.0)
Platelets: 228 10*3/uL (ref 150–400)
RBC: 2.61 MIL/uL — ABNORMAL LOW (ref 3.87–5.11)
RDW: 17.4 % — ABNORMAL HIGH (ref 11.5–15.5)
WBC: 7.5 10*3/uL (ref 4.0–10.5)
nRBC: 0 % (ref 0.0–0.2)

## 2019-08-15 LAB — PREPARE RBC (CROSSMATCH)

## 2019-08-15 LAB — POC OCCULT BLOOD, ED: Fecal Occult Bld: POSITIVE — AB

## 2019-08-15 LAB — GLUCOSE, CAPILLARY: Glucose-Capillary: 84 mg/dL (ref 70–99)

## 2019-08-15 MED ORDER — HYDROXYZINE HCL 25 MG PO TABS
25.0000 mg | ORAL_TABLET | Freq: Three times a day (TID) | ORAL | Status: DC | PRN
  Administered 2019-08-16: 25 mg via ORAL
  Filled 2019-08-15: qty 1

## 2019-08-15 MED ORDER — PANTOPRAZOLE SODIUM 40 MG PO TBEC
40.0000 mg | DELAYED_RELEASE_TABLET | Freq: Two times a day (BID) | ORAL | Status: DC
  Filled 2019-08-15: qty 1

## 2019-08-15 MED ORDER — SODIUM CHLORIDE 0.9 % IV SOLN: 10.0000 mL/h | Freq: Once | INTRAVENOUS | Status: DC

## 2019-08-15 MED ORDER — INSULIN ASPART 100 UNIT/ML ~~LOC~~ SOLN: 0.0000 [IU] | Freq: Every day | SUBCUTANEOUS | Status: DC

## 2019-08-15 MED ORDER — LOPERAMIDE HCL 2 MG PO CAPS
2.0000 mg | ORAL_CAPSULE | Freq: Every day | ORAL | Status: DC
  Administered 2019-08-16: 2 mg via ORAL
  Filled 2019-08-15: qty 1

## 2019-08-15 MED ORDER — VITAMIN D 25 MCG (1000 UNIT) PO TABS
1000.0000 [IU] | ORAL_TABLET | Freq: Every day | ORAL | Status: DC
  Administered 2019-08-16: 1000 [IU] via ORAL
  Filled 2019-08-15: qty 1

## 2019-08-15 MED ORDER — FOLIC ACID 1 MG PO TABS
1.0000 mg | ORAL_TABLET | Freq: Every day | ORAL | Status: DC
  Administered 2019-08-16: 10:00:00 1 mg via ORAL
  Filled 2019-08-15: qty 1

## 2019-08-15 MED ORDER — RISAQUAD PO CAPS
1.0000 | ORAL_CAPSULE | Freq: Two times a day (BID) | ORAL | Status: DC
  Administered 2019-08-16: 1 via ORAL
  Filled 2019-08-15 (×2): qty 1

## 2019-08-15 MED ORDER — SODIUM CHLORIDE 0.9 % IV SOLN
INTRAVENOUS | Status: AC
  Administered 2019-08-15: 22:00:00 via INTRAVENOUS

## 2019-08-15 MED ORDER — MAGNESIUM OXIDE 400 (241.3 MG) MG PO TABS
400.0000 mg | ORAL_TABLET | Freq: Every day | ORAL | Status: DC
  Administered 2019-08-16: 400 mg via ORAL
  Filled 2019-08-15 (×2): qty 1

## 2019-08-15 MED ORDER — SODIUM CHLORIDE 0.9% FLUSH: 3.0000 mL | INTRAVENOUS | Status: DC | PRN

## 2019-08-15 MED ORDER — ACETAMINOPHEN 325 MG PO TABS: 650.0000 mg | ORAL_TABLET | Freq: Four times a day (QID) | ORAL | Status: DC | PRN

## 2019-08-15 MED ORDER — DILTIAZEM HCL 60 MG PO TABS
30.0000 mg | ORAL_TABLET | Freq: Two times a day (BID) | ORAL | Status: DC
  Administered 2019-08-15 – 2019-08-16 (×2): 30 mg via ORAL
  Filled 2019-08-15 (×2): qty 1

## 2019-08-15 MED ORDER — LORATADINE 10 MG PO TABS
10.0000 mg | ORAL_TABLET | Freq: Every day | ORAL | Status: DC
  Administered 2019-08-16: 10 mg via ORAL
  Filled 2019-08-15: qty 1

## 2019-08-15 MED ORDER — CARVEDILOL 6.25 MG PO TABS
6.2500 mg | ORAL_TABLET | Freq: Two times a day (BID) | ORAL | Status: DC
  Administered 2019-08-16: 6.25 mg via ORAL
  Filled 2019-08-15: qty 1

## 2019-08-15 MED ORDER — CALCIUM GLUCONATE-NACL 1-0.675 GM/50ML-% IV SOLN
1.0000 g | Freq: Once | INTRAVENOUS | Status: AC
  Administered 2019-08-16: 1000 mg via INTRAVENOUS
  Filled 2019-08-15: qty 50

## 2019-08-15 MED ORDER — SODIUM CHLORIDE 0.9 % IV SOLN: 250.0000 mL | INTRAVENOUS | Status: DC | PRN

## 2019-08-15 MED ORDER — PRO-STAT SUGAR FREE PO LIQD
30.0000 mL | Freq: Two times a day (BID) | ORAL | Status: DC
  Administered 2019-08-15 – 2019-08-16 (×2): 30 mL via ORAL
  Filled 2019-08-15 (×2): qty 30

## 2019-08-15 MED ORDER — SERTRALINE HCL 100 MG PO TABS
100.0000 mg | ORAL_TABLET | Freq: Every day | ORAL | Status: DC
  Administered 2019-08-16: 100 mg via ORAL
  Filled 2019-08-15: qty 1

## 2019-08-15 MED ORDER — SODIUM CHLORIDE 0.9% FLUSH
3.0000 mL | Freq: Two times a day (BID) | INTRAVENOUS | Status: DC
  Administered 2019-08-15: 3 mL via INTRAVENOUS
  Administered 2019-08-16: 11:00:00 via INTRAVENOUS
  Administered 2019-08-16 – 2019-08-17 (×3): 3 mL via INTRAVENOUS
  Administered 2019-08-18 – 2019-08-19 (×2): 10 mL via INTRAVENOUS
  Administered 2019-08-19 – 2019-08-20 (×2): 3 mL via INTRAVENOUS

## 2019-08-15 MED ORDER — CHLORHEXIDINE GLUCONATE 0.12 % MT SOLN
15.0000 mL | Freq: Two times a day (BID) | OROMUCOSAL | Status: DC
  Administered 2019-08-15 – 2019-08-16 (×2): 15 mL via OROMUCOSAL
  Filled 2019-08-15: qty 15

## 2019-08-15 MED ORDER — MELATONIN 3 MG PO TABS
1.0000 | ORAL_TABLET | Freq: Every day | ORAL | Status: DC
  Administered 2019-08-15: 23:00:00 3 mg via ORAL
  Filled 2019-08-15 (×2): qty 1

## 2019-08-15 MED ORDER — SODIUM POLYSTYRENE SULFONATE 15 GM/60ML PO SUSP
15.0000 g | Freq: Once | ORAL | Status: AC
  Administered 2019-08-15: 15 g via ORAL
  Filled 2019-08-15: qty 60

## 2019-08-15 MED ORDER — ACIDOPHILUS LACTOBACILLUS PO CAPS: ORAL_CAPSULE | Freq: Two times a day (BID) | ORAL | Status: DC

## 2019-08-15 MED ORDER — ACETAMINOPHEN 650 MG RE SUPP: 650.0000 mg | Freq: Four times a day (QID) | RECTAL | Status: DC | PRN

## 2019-08-15 MED ORDER — PHENOL 1.4 % MT LIQD
1.0000 | Freq: Two times a day (BID) | OROMUCOSAL | Status: DC
  Administered 2019-08-17 – 2019-08-19 (×3): 1 via OROMUCOSAL
  Filled 2019-08-15: qty 177

## 2019-08-15 MED ORDER — ADULT MULTIVITAMIN W/MINERALS CH
1.0000 | ORAL_TABLET | Freq: Every day | ORAL | Status: DC
  Administered 2019-08-16: 1 via ORAL
  Filled 2019-08-15: qty 1

## 2019-08-15 MED ORDER — CHLORHEXIDINE GLUCONATE CLOTH 2 % EX PADS
6.0000 | MEDICATED_PAD | Freq: Every day | CUTANEOUS | Status: DC
  Administered 2019-08-15 – 2019-08-20 (×6): 6 via TOPICAL

## 2019-08-15 MED ORDER — LEVOTHYROXINE SODIUM 100 MCG PO TABS
100.0000 ug | ORAL_TABLET | Freq: Every day | ORAL | Status: DC
  Administered 2019-08-16: 100 ug via ORAL
  Filled 2019-08-15: qty 1

## 2019-08-15 MED ORDER — INSULIN ASPART 100 UNIT/ML ~~LOC~~ SOLN: 0.0000 [IU] | Freq: Three times a day (TID) | SUBCUTANEOUS | Status: DC

## 2019-08-15 MED ORDER — FERROUS SULFATE 325 (65 FE) MG PO TABS
325.0000 mg | ORAL_TABLET | Freq: Every day | ORAL | Status: DC
  Administered 2019-08-16: 325 mg via ORAL
  Filled 2019-08-15: qty 1

## 2019-08-15 NOTE — ED Notes (Signed)
Patient signed consent form for blood transfusion . 

## 2019-08-15 NOTE — ED Notes (Signed)
IV team consult ordered , unable to insert peripheral IV access multiple times , admitting MD notified .

## 2019-08-15 NOTE — ED Notes (Signed)
ED TO INPATIENT HANDOFF REPORT  ED Nurse Name and Phone #:  Clydene Laming [phone]  S Name/Age/Gender Shirley Malone 78 y.o. female Room/Bed: TRAAC/TRAAC  Code Status   Code Status: Full Code  Home/SNF/Other: [facility]     Triage Complete: Triage complete  Chief Complaint Trach Vent Pt, with Hypotension  Triage Note Pt here from [facility] for hypotension-per staff 75/40 @ 1400 today with multiple consistent readings. EMS called, pt normotensive but staff still wanted to transport. Pt normotensive with [facility] for entirety. Pt ambulatory at facility prior to leaving. Pt ambulated to bedside commode, had bowel movement, and ambulated to [facility] stretcher without difficulty.    Allergies Allergies  Allergen Reactions  . Clarithromycin Itching and Rash  . Codeine Itching    Other reaction(s): Unknown  . Latex Hives    Other reaction(s): Other Blisters   . Other     adhesive on tape    Level of Care/Admitting Diagnosis ED Disposition    ED Disposition Condition Malone: Shirley [[id number]]  Level of Care: Progressive [102]  I expect the patient will be discharged within 24 hours: No (not a candidate for 5C-Observation unit)  Covid Evaluation: Asymptomatic Screening Protocol (No Symptoms)  Diagnosis: Hypotension [id number]  Admitting Physician: Jani Gravel [[id number]]  Attending Physician: Jani Gravel [[id number]]  PT Class (Do Not Modify): Observation [104]  PT Acc Code (Do Not Modify): Observation [10022]       B Medical/Surgery History Past Medical History:  Diagnosis Date  . Anemia   . Atrial fibrillation ([place])   . Chronic kidney disease   . Chronic respiratory failure ([place])   . Diabetes mellitus without complication ([place])   . DVT (deep venous thrombosis) ([place])   . Gastrointestinal bleed   . Morbid obesity ([place])   . 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 Thyroid disease    Past Surgical  History:  Procedure Laterality Date  . PEG TUBE PLACEMENT    . TRACHEOSTOMY       A IV Location/Drains/Wounds Patient Lines/Drains/Airways Status   Active Line/Drains/Airways    Name:   Placement date:   Placement time:   Site:   Days:   Gastrostomy/Enterostomy PEG-jejunostomy LUQ   07/31/19    2113    LUQ   15   External Urinary Catheter   07/30/19    2247    -   16   Tracheostomy Portex 7 mm Cuffed   -    -    7 mm      Tracheostomy Portex 7 mm Cuffed   08/15/19    1625    7 mm   less than 1          Intake/Output Last 24 hours No intake or output data in the 24 hours ending 08/15/19 2021  Labs/Imaging Results for orders placed or performed during the hospital encounter of 08/15/19 (from the past 48 hour(s))  POC occult blood, ED Provider will collect     Status: Abnormal   Collection Time: 08/15/19  4:39 PM  Result Value Ref Range   Fecal Occult Bld POSITIVE (A) NEGATIVE  CBC     Status: Abnormal   Collection Time: 08/15/19  4:59 PM  Result Value Ref Range   WBC 7.5 4.0 - 10.5 K/uL   RBC 2.61 (L) 3.87 - 5.11 MIL/uL   Hemoglobin 7.3 (L) 12.0 - 15.0 g/dL   HCT  23.5 (L) 36.0 - 46.0 %   MCV 90.0 80.0 - 100.0 fL   MCH 28.0 26.0 - 34.0 pg   MCHC 31.1 30.0 - 36.0 g/dL   RDW 17.4 (H) 11.5 - 15.5 %   Platelets 228 150 - 400 K/uL   nRBC 0.0 0.0 - 0.2 %    Comment: Performed at [facility] [address], [place], [place] [id number]  Basic metabolic panel     Status: Abnormal   Collection Time: 08/15/19  4:59 PM  Result Value Ref Range   Sodium 135 135 - 145 mmol/L   Potassium 5.4 (H) 3.5 - 5.1 mmol/L   Chloride 100 98 - 111 mmol/L   CO2 23 22 - 32 mmol/L   Glucose, Bld 102 (H) 70 - 99 mg/dL   BUN 121 (H) 8 - 23 mg/dL   Creatinine, Ser 4.36 (H) 0.44 - 1.00 mg/dL   Calcium 9.1 8.9 - 10.3 mg/dL   GFR calc non Af Amer 9 (L) >60 mL/min   GFR calc Af Amer 11 (L) >60 mL/min   Anion gap 12 5 - 15    Comment: Performed at [facility] [address]  [place], [place] [address]  Type and screen     Status: None (Preliminary result)   Collection Time: 08/15/19  5:51 PM  Result Value Ref Range   ABO/RH(D) O POS    Antibody Screen NEG    Sample Expiration      08/18/2019,2359 Performed at [facility], [place] [address], [place], [place] [address]    Unit Number [id number]    Blood Component Type RED CELLS,LR    Unit division 00    Status of Unit ALLOCATED    Transfusion Status OK TO TRANSFUSE    Crossmatch Result Compatible   Prepare RBC     Status: None   Collection Time: 08/15/19  5:51 PM  Result Value Ref Range   Order Confirmation      ORDER PROCESSED BY BLOOD BANK Performed at [facility], [place] [address] [place],  [address]    Dg Chest Port 1 View  Result Date: 08/15/2019 CLINICAL DATA:  Low blood pressure EXAM: PORTABLE CHEST 1 VIEW COMPARISON:  07/30/2019, 08/27/2017 FINDINGS: Tip of the tracheostomy tube appears at the thoracic inlet, slightly prominent convex distension of the tracheostomy balloon at the thoracic inlet. Marked cardiomegaly without overt failure. No large effusion. No focal consolidation. IMPRESSION: 1. Tracheostomy tube tip at the thoracic inlet with slight convex lucency around the tip suggesting prominent distention of tracheostomy balloon. 2. Cardiomegaly without edema or infiltrate. Electronically Signed   By: Donavan Foil M.D.   On: 08/15/2019 18:30    Pending Labs Unresulted Labs (From admission, onward)    Start     Ordered   08/16/19 0500  Comprehensive metabolic panel  Tomorrow morning,   R     08/15/19 1849   08/16/19 0500  CBC  Tomorrow morning,   R     08/15/19 1849   08/15/19 1939  Gastrointestinal Panel by PCR , Stool  (Gastrointestinal Panel by PCR, Stool                                                                                                                                                     *  Does Not include CLOSTRIDIUM DIFFICILE testing.**If CDIFF testing is  needed, select the C Difficile Quick Screen w PCR reflex order below)  Once,   STAT     08/15/19 1938   08/15/19 1939  C difficile quick scan w PCR reflex  (C Difficile quick screen w PCR reflex panel)  Once, for 24 hours,   STAT     08/15/19 1938   08/15/19 1930  Cortisol  Once,   STAT     08/15/19 1929   08/15/19 1741  SARS CORONAVIRUS 2 (TAT 6-24 HRS) Nasopharyngeal Nasopharyngeal Swab  (Asymptomatic/Tier 2 Patients Labs)  Once,   STAT    Question Answer Comment  Is this test for diagnosis or screening Screening   Symptomatic for COVID-19 as defined by CDC No   Hospitalized for COVID-19 No   Admitted to ICU for COVID-19 No   Previously tested for COVID-19 Yes   Resident in a congregate (group) care setting Yes   Employed in healthcare setting No   Pregnant No      08/15/19 1741   08/15/19 1635  Urinalysis, Routine w reflex microscopic  ONCE - STAT,   STAT     08/15/19 1635          Vitals/Pain Today's Vitals   08/15/19 1921 08/15/19 1922 08/15/19 1945 08/15/19 1948  BP: 97/77  108/71   Pulse: 86  84 82  Resp: 16  16 16   Temp: (!) 97.1 F (36.2 C)     TempSrc: Temporal     SpO2: 100%  100% 100%  PainSc: 0-No pain 0-No pain 0-No pain     Isolation Precautions Enteric precautions (UV disinfection)  Medications Medications  0.9 %  sodium chloride infusion (has no administration in time range)  sodium chloride flush (NS) 0.9 % injection 3 mL (has no administration in time range)  sodium chloride flush (NS) 0.9 % injection 3 mL (has no administration in time range)  0.9 %  sodium chloride infusion (has no administration in time range)  acetaminophen (TYLENOL) tablet 650 mg (has no administration in time range)    Or  acetaminophen (TYLENOL) suppository 650 mg (has no administration in time range)  calcium gluconate 1 g/ 50 mL sodium chloride IVPB (has no administration in time range)  0.9 %  sodium chloride infusion (has no administration in time range)  sodium  polystyrene (KAYEXALATE) 15 GM/60ML suspension 15 g (15 g Oral Given 08/15/19 2000)    Mobility walks Moderate fall risk   Focused Assessments IV team notified for IV access .   R Recommendations: See Admitting Provider Note  Report given to:   Additional Notes:

## 2019-08-15 NOTE — ED Notes (Signed)
IV nurse at bedside , report given to [facility] , 3Mrm.11.

## 2019-08-15 NOTE — ED Provider Notes (Signed)
[facility] EMERGENCY DEPARTMENT Provider Note   CSN: [id number] Arrival date & time: 08/15/19  1618     History   Chief Complaint Chief Complaint  Patient presents with  . Hypotension    HPI Shirley Malone is a 78 y.o. female.     Patient with hx afib, gib, chronic resp failure/trach, from [facility] via EMS for report of low blood pressure today 75/40. On EMS arrival, they indicate patients bp was normal. Patients mental status reported at baseline. No increased o2 requirement noted. No fevers. No report of trauma or fall. Pt denies pain of any sort. No headache, no chest pain or discomfort, no abd pain. No nvd. No gu c/o. No known blood loss. Pt limited historian/trach - level 5 caveat.   The history is provided by the patient and the EMS personnel. The history is limited by the condition of the patient.    Past Medical History:  Diagnosis Date  . Anemia   . Atrial fibrillation ([place])   . Chronic kidney disease   . Chronic respiratory failure ([place])   . Diabetes mellitus without complication ([place])   . DVT (deep venous thrombosis) ([place])   . Gastrointestinal bleed   . Morbid obesity ([place])   . 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 Thyroid disease     Patient Active Problem List   Diagnosis Date Noted  . GI bleed 08/01/2019  . Symptomatic anemia   . Anticoagulated   . Acute blood loss anemia 07/30/2019  . Respiratory failure [facility])     Past Surgical History:  Procedure Laterality Date  . PEG TUBE PLACEMENT    . TRACHEOSTOMY       OB History   No obstetric history on file.      Home Medications    Prior to Admission medications   Medication Sig Start Date End Date Taking? Authorizing Provider  ACIDOPHILUS LACTOBACILLUS PO Take 1 tablet by mouth 2 (two) times daily.    [provider]  Amino Acids-Protein Hydrolys (FEEDING SUPPLEMENT, PRO-STAT SUGAR FREE 64,) LIQD Take 30 mLs by mouth every  12 (twelve) hours.    [provider]  carvedilol (COREG) 12.5 MG tablet Take 12.5 mg by mouth 2 (two) times daily.    [provider]  cetirizine (ZYRTEC) 10 MG tablet Take 10 mg by mouth daily.    [provider]  chlorhexidine (PERIDEX) 0.12 % solution Use as directed 15 mLs in the mouth or throat 2 (two) times daily.    [provider]  Cholecalciferol (VITAMIN D3) 50 MCG (2000 UT) TABS Take 1 tablet by mouth daily.    [provider]  Darbepoetin Alfa (ARANESP) 25 MCG/0.42ML SOSY injection Inject 25 mcg into the skin every 7 (seven) days.    [provider]  diltiazem (CARDIZEM) 30 MG tablet Take 30 mg by mouth 2 (two) times daily.    [provider]  ferrous sulfate 325 (65 FE) MG tablet Take 325 mg by mouth daily with breakfast.    [provider]  folic acid (FOLVITE) 1 MG tablet Take 1 mg by mouth daily.    [provider]  levothyroxine (SYNTHROID) 100 MCG tablet Take 100 mcg by mouth daily before breakfast.    [provider]  loperamide (IMODIUM A-D) 2 MG tablet Take 2 mg by mouth daily.    [provider]  magnesium oxide (MAG-OX) 400 MG tablet Take 400 mg by  mouth every 12 (twelve) hours.    [provider]  Melatonin 3 MG TABS Take 1 tablet by mouth at bedtime.    [provider]  Multiple Vitamin (MULTIVITAMIN WITH MINERALS) TABS tablet Take 1 tablet by mouth daily.    [provider]  pantoprazole (PROTONIX) 40 MG tablet Take 1 tablet (40 mg total) by mouth 2 (two) times daily. 08/01/19   Shelly Coss, MD  phenol (CHLORASEPTIC) 1.4 % LIQD Use as directed 1 spray in the mouth or throat every 12 (twelve) hours.    [provider]  sertraline (ZOLOFT) 100 MG tablet Take 100 mg by mouth daily.    [provider]    Family History No family history on file.  Social History Social History   Tobacco Use  . Smoking status: Never Smoker   . Smokeless tobacco: Never Used  Substance Use Topics  . Alcohol use: Not Currently  . Drug use: Not Currently     Allergies   Clarithromycin, Codeine, Latex, and Other   Review of Systems Review of Systems  Constitutional: Negative for fever.  HENT: Negative for sore throat.   Eyes: Negative for pain and visual disturbance.  Respiratory: Negative for cough and shortness of breath.   Cardiovascular: Negative for chest pain.  Gastrointestinal: Negative for abdominal pain.  Genitourinary: Negative for flank pain.  Musculoskeletal: Negative for back pain and neck pain.  Skin: Negative for rash.  Neurological: Negative for headaches.  Hematological:       Anticoag currently on hold post recent d/c  Psychiatric/Behavioral: Negative for agitation.     Physical Exam Updated Vital Signs BP 113/81   Pulse 70   Temp (!) 97.4 F (36.3 C) (Temporal)   Resp 16   SpO2 100%   Physical Exam Vitals signs and nursing note reviewed.  Constitutional:      Appearance: Normal appearance. She is well-developed.  HENT:     Head: Atraumatic.     Nose: Nose normal.     Mouth/Throat:     Mouth: Mucous membranes are moist.  Eyes:     General: No scleral icterus.    Conjunctiva/sclera: Conjunctivae normal.     Pupils: Pupils are equal, round, and reactive to light.  Neck:     Musculoskeletal: Normal range of motion and neck supple. No neck rigidity or muscular tenderness.     Trachea: No tracheal deviation.     Comments: Trach in place, no purulent drainage or other sign of infection at site.  Cardiovascular:     Rate and Rhythm: Normal rate. Rhythm irregular.     Pulses: Normal pulses.     Heart sounds: Normal heart sounds. No murmur. No friction rub. No gallop.   Pulmonary:     Effort: Pulmonary effort is normal. No respiratory distress.     Breath sounds: Normal breath sounds.  Abdominal:     General: Bowel sounds are normal. There is no distension.     Palpations: Abdomen is  soft.     Tenderness: There is no abdominal tenderness. There is no guarding.     Comments: obese  Genitourinary:    Comments: No cva tenderness. Soft, greenish stool.  Musculoskeletal:        General: No swelling or tenderness.  Skin:    General: Skin is warm and dry.     Findings: No rash.  Neurological:     Mental Status: She is alert.     Comments: Awake and alert. Moves bil  ext purposefully with good strength. Follows commands.   Psychiatric:        Mood and Affect: Mood normal.      ED Treatments / Results  Labs (all labs ordered are listed, but only abnormal results are displayed) Results for orders placed or performed during the hospital encounter of 08/15/19  CBC  Result Value Ref Range   WBC 7.5 4.0 - 10.5 K/uL   RBC 2.61 (L) 3.87 - 5.11 MIL/uL   Hemoglobin 7.3 (L) 12.0 - 15.0 g/dL   HCT 23.5 (L) 36.0 - 46.0 %   MCV 90.0 80.0 - 100.0 fL   MCH 28.0 26.0 - 34.0 pg   MCHC 31.1 30.0 - 36.0 g/dL   RDW 17.4 (H) 11.5 - 15.5 %   Platelets 228 150 - 400 K/uL   nRBC 0.0 0.0 - 0.2 %  Basic metabolic panel  Result Value Ref Range   Sodium 135 135 - 145 mmol/L   Potassium 5.4 (H) 3.5 - 5.1 mmol/L   Chloride 100 98 - 111 mmol/L   CO2 23 22 - 32 mmol/L   Glucose, Bld 102 (H) 70 - 99 mg/dL   BUN 121 (H) 8 - 23 mg/dL   Creatinine, Ser 4.36 (H) 0.44 - 1.00 mg/dL   Calcium 9.1 8.9 - 10.3 mg/dL   GFR calc non Af Amer 9 (L) >60 mL/min   GFR calc Af Amer 11 (L) >60 mL/min   Anion gap 12 5 - 15  POC occult blood, ED Provider will collect  Result Value Ref Range   Fecal Occult Bld POSITIVE (A) NEGATIVE   Dg Chest Port 1 View  Result Date: 07/31/2019 CLINICAL DATA:  Respiratory failure. EXAM: PORTABLE CHEST 1 VIEW COMPARISON:  08/27/2017 FINDINGS: Tracheostomy tube tip at the thoracic inlet. Cardiomegaly which appears similar to prior exam. Tortuous thoracic aorta. Hazy opacity at the right lung base consistent with pleural effusion and atelectasis/airspace disease. Vascular  congestion without overt edema. No pneumothorax. Chronic change about both shoulders. IMPRESSION: 1. Hazy opacity at the right lung base consistent with pleural effusion and atelectasis/airspace disease. Chronic cardiomegaly. Vascular congestion. 2. Tracheostomy tube tip at the thoracic inlet. Electronically Signed   By: Keith Rake M.D.   On: 07/31/2019 00:38    EKG EKG Interpretation  Date/Time:  Thursday August 15 2019 17:03:11 EDT Ventricular Rate:  71 PR Interval:    QRS Duration: 98 QT Interval:  414 QTC Calculation: 450 R Axis:   18 Text Interpretation:  Atrial fibrillation Nonspecific T wave abnormality Confirmed by Lajean Saver [phone]) on 08/15/2019 5:39:52 PM   Radiology No results found.  Procedures Procedures (including critical care time)  Medications Ordered in ED Medications  0.9 %  sodium chloride infusion (has no administration in time range)     Initial Impression / Assessment and Plan / ED Course  I have reviewed the triage vital signs and the nursing notes.  Pertinent labs & imaging results that were available during my care of the patient were reviewed by me and considered in my medical decision making (see chart for details).  Iv ns. Labs. Continuous pulse ox and monitor.   Reviewed nursing notes and prior charts for additional history.   Labs reviewed by me - renal fxn increased from prior reflecting of AKI, markedly elev bun may also reflect gi bleeding. Stool heme pos. Given earlier hypotension, heme pos stools, symptomatic anemia - will transfuse.   Hospitalist d/c 13 days ago - will consult for admission  re hypotension, symptomatic anemia, AKI.   CXR reviewed by me - no pna.   CRITICAL CARE RE: hypotension, symptomatic anemia/gi bleeding requiring prbc transfusion, AKI, chronic respiratory failure Performed by: Mirna Mires Total critical care time: 40 minutes Critical care time was exclusive of separately billable procedures and  treating other patients. Critical care was necessary to treat or prevent imminent or life-threatening deterioration. Critical care was time spent personally by me on the following activities: development of treatment plan with patient and/or surrogate as well as nursing, discussions with consultants, evaluation of patient's response to treatment, examination of patient, obtaining history from patient or surrogate, ordering and performing treatments and interventions, ordering and review of laboratory studies, ordering and review of radiographic studies, pulse oximetry and re-evaluation of patient's condition.    Final Clinical Impressions(s) / ED Diagnoses   Final diagnoses:  None    ED Discharge Orders    None       Lajean Saver, MD 08/15/19 1743

## 2019-08-15 NOTE — H&P (Addendum)
TRH H&P    Patient Demographics:    Shirley Malone, is a 78 y.o. female  MRN: [id number]  DOB - December 06, 1941  Admit Date - 08/15/2019  Referring MD/NP/PA: Alphonzo Lemmings  Outpatient Primary MD for the patient is Nona Dell, Corene Cornea, MD  Patient coming from:  [facility]  Chief complaint-   hypotension   HPI:    Shirley Malone  is a 78 y.o. female,  w hypertension, Dm2, ckd stage 4, Pafib, h/o DVT, apparently presents with c/o hypotension per [facility] 75/40.  Pt's lowest bp documented in computer 106/58.  Pt noted slight diarrhea (4x per day, somewhat chronic),  dyspnea, but denies fever, chills, cough, cp, palp,n/v, abd pain, brbpr.     In ED,  T 97.4, P 61  R 16, Bp 133/81  Pox 100% on vent  Na 135, K 5.4, Bun 121, Creatinine 4.36 Wbc 7.5, Hgb 7.3, Plt 228 FOBT + Type and screen  covid -19 pending  CXR IMPRESSION: 1. Tracheostomy tube tip at the thoracic inlet with slight convex lucency around the tip suggesting prominent distention of tracheostomy balloon. 2. Cardiomegaly without edema or infiltrate.  Transfusion of 1 unit prbc ordered by ED,   Pt will be admitted for hypotension, symptomatic anemia, and mild ARF on chronic renal failure     Review of systems:    In addition to the HPI above,  No Fever-chills, No Headache, No changes with Vision or hearing, No problems swallowing food or Liquids, No Chest pain, Cough  No Abdominal pain, No Nausea or Vomiting,   No Blood in stool or Urine, No dysuria, No new skin rashes or bruises, No new joints pains-aches,  No new weakness, tingling, numbness in any extremity, No recent weight gain or loss, No polyuria, polydypsia or polyphagia, No significant Mental Stressors.  All other systems reviewed and are negative.    Past History of the following :    Past Medical History:  Diagnosis Date  . Anemia   . Atrial fibrillation ([place])    . Chronic kidney disease   . Chronic respiratory failure ([place])   . Diabetes mellitus without complication ([place])   . DVT (deep venous thrombosis) ([place])   . Gastrointestinal bleed   . Morbid obesity ([place])   . 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 Thyroid disease       Past Surgical History:  Procedure Laterality Date  . PEG TUBE PLACEMENT    . TRACHEOSTOMY        Social History:      Social History   Tobacco Use  . Smoking status: Never Smoker  . Smokeless tobacco: Never Used  Substance Use Topics  . Alcohol use: Not Currently       Family History :    No family history on file.  pt doesn't recall any family history   Home Medications:   Prior to Admission medications   Medication Sig Start Date End Date Taking? Authorizing Provider  ACIDOPHILUS LACTOBACILLUS PO Take 1 tablet by mouth  2 (two) times daily.    [provider]  Amino Acids-Protein Hydrolys (FEEDING SUPPLEMENT, PRO-STAT SUGAR FREE 64,) LIQD Take 30 mLs by mouth every 12 (twelve) hours.    [provider]  carvedilol (COREG) 12.5 MG tablet Take 12.5 mg by mouth 2 (two) times daily.    [provider]  cetirizine (ZYRTEC) 10 MG tablet Take 10 mg by mouth daily.    [provider]  chlorhexidine (PERIDEX) 0.12 % solution Use as directed 15 mLs in the mouth or throat 2 (two) times daily.    [provider]  Cholecalciferol (VITAMIN D3) 50 MCG (2000 UT) TABS Take 1 tablet by mouth daily.    [provider]  Darbepoetin Alfa (ARANESP) 25 MCG/0.42ML SOSY injection Inject 25 mcg into the skin every 7 (seven) days.    [provider]  diltiazem (CARDIZEM) 30 MG tablet Take 30 mg by mouth 2 (two) times daily.    [provider]  ferrous sulfate 325 (65 FE) MG tablet Take 325 mg by mouth daily with breakfast.    [provider]  folic acid (FOLVITE) 1 MG tablet Take 1 mg by mouth daily.    [provider]  levothyroxine (SYNTHROID) 100 MCG tablet Take 100 mcg by mouth daily before breakfast.    [provider]  loperamide (IMODIUM A-D) 2 MG tablet Take 2 mg by mouth daily.    [provider]  magnesium oxide (MAG-OX) 400 MG tablet Take 400 mg by mouth every 12 (twelve) hours.    [provider]  Melatonin 3 MG TABS Take 1 tablet by mouth at bedtime.    [provider]  Multiple Vitamin (MULTIVITAMIN WITH MINERALS) TABS tablet Take 1 tablet by mouth daily.    [provider]  pantoprazole (PROTONIX) 40 MG tablet Take 1 tablet (40 mg total) by mouth 2 (two) times daily. 08/01/19   Shelly Coss, MD  phenol (CHLORASEPTIC) 1.4 % LIQD Use as directed 1 spray in the mouth or throat every 12 (twelve) hours.    [provider]  sertraline (ZOLOFT) 100 MG tablet Take 100 mg by mouth daily.    [provider]     Allergies:     Allergies  Allergen Reactions  . Clarithromycin Itching and Rash  . Codeine Itching    Other reaction(s): Unknown  . Latex Hives    Other reaction(s): Other Blisters   . Other     adhesive on tape     Physical Exam:   Vitals  Blood pressure 122/76, pulse 71, temperature (!) 97.4 F (36.3 C), temperature source Temporal, resp. rate (!) 24, SpO2 100 %.  1.  General: axoxo3  2. Psychiatric: euthymic  3. Neurologic: cn2-12 intact, reflexes 2+ symmetric, diffuse with no clonus, motor 5/5 in all 4ext  4. HEENMT:  Anicteric, pale conjuctiva, pupils 1.28mm symmetric, direct, consensual, near intact Neck: no jvd, + trach  5. Respiratory : CTAB  6. Cardiovascular : Irr, irr, s1, s2, no m/g/r  7. Gastrointestinal:  Abd: soft, obese, nt, nd, +bs  + peg  8. Skin:  Ext: no c/c/e,  No rash Onychomycosis  9.Musculoskeletal:  Good ROM,  No adenopathy    Data Review:    CBC Recent Labs  Lab 08/15/19 1659  WBC 7.5  HGB 7.3*  HCT 23.5*  PLT 228  MCV 90.0  MCH 28.0  MCHC  31.1  RDW 17.4*   ------------------------------------------------------------------------------------------------------------------  Results for orders placed or performed  during the hospital encounter of 08/15/19 (from the past 48 hour(s))  POC occult blood, ED Provider will collect     Status: Abnormal   Collection Time: 08/15/19  4:39 PM  Result Value Ref Range   Fecal Occult Bld POSITIVE (A) NEGATIVE  CBC     Status: Abnormal   Collection Time: 08/15/19  4:59 PM  Result Value Ref Range   WBC 7.5 4.0 - 10.5 K/uL   RBC 2.61 (L) 3.87 - 5.11 MIL/uL   Hemoglobin 7.3 (L) 12.0 - 15.0 g/dL   HCT 23.5 (L) 36.0 - 46.0 %   MCV 90.0 80.0 - 100.0 fL   MCH 28.0 26.0 - 34.0 pg   MCHC 31.1 30.0 - 36.0 g/dL   RDW 17.4 (H) 11.5 - 15.5 %   Platelets 228 150 - 400 K/uL   nRBC 0.0 0.0 - 0.2 %    Comment: Performed at [facility], [place] [address] [place], [place] [id number]  Basic metabolic panel     Status: Abnormal   Collection Time: 08/15/19  4:59 PM  Result Value Ref Range   Sodium 135 135 - 145 mmol/L   Potassium 5.4 (H) 3.5 - 5.1 mmol/L   Chloride 100 98 - 111 mmol/L   CO2 23 22 - 32 mmol/L   Glucose, Bld 102 (H) 70 - 99 mg/dL   BUN 121 (H) 8 - 23 mg/dL   Creatinine, Ser 4.36 (H) 0.44 - 1.00 mg/dL   Calcium 9.1 8.9 - 10.3 mg/dL   GFR calc non Af Amer 9 (L) >60 mL/min   GFR calc Af Amer 11 (L) >60 mL/min   Anion gap 12 5 - 15    Comment: Performed at [facility] [address] [place], [place] [address]  Type and screen     Status: None (Preliminary result)   Collection Time: 08/15/19  5:51 PM  Result Value Ref Range   ABO/RH(D) PENDING    Antibody Screen PENDING    Sample Expiration      08/18/2019,2359 Performed at [facility], [place] [address] [place], [place] [address]   Prepare RBC     Status: None   Collection Time: 08/15/19  5:51 PM  Result Value Ref Range   Order Confirmation      ORDER PROCESSED BY BLOOD BANK Performed at [facility],  [place] [address], [place], [place] [address]     Chemistries  Recent Labs  Lab 08/15/19 1659  NA 135  K 5.4*  CL 100  CO2 23  GLUCOSE 102*  BUN 121*  CREATININE 4.36*  CALCIUM 9.1   ------------------------------------------------------------------------------------------------------------------  ------------------------------------------------------------------------------------------------------------------ GFR: CrCl cannot be calculated (Unknown ideal weight.). Liver Function Tests: No results for input(s): AST, ALT, ALKPHOS, BILITOT, PROT, ALBUMIN in the last 168 hours. No results for input(s): LIPASE, AMYLASE in the last 168 hours. No results for input(s): AMMONIA in the last 168 hours. Coagulation Profile: No results for input(s): INR, PROTIME in the last 168 hours. Cardiac Enzymes: No results for input(s): CKTOTAL, CKMB, CKMBINDEX, TROPONINI in the last 168 hours. BNP (last 3 results) No results for input(s): PROBNP in the last 8760 hours. HbA1C: No results for input(s): HGBA1C in the last 72 hours. CBG: No results for input(s): GLUCAP in the last 168 hours. Lipid Profile: No results for input(s): CHOL, HDL, LDLCALC, TRIG, CHOLHDL, LDLDIRECT in the last 72 hours. Thyroid Function Tests: No results for input(s): TSH, T4TOTAL, FREET4, T3FREE, THYROIDAB in the  last 72 hours. Anemia Panel: No results for input(s): VITAMINB12, FOLATE, FERRITIN, TIBC, IRON, RETICCTPCT in the last 72 hours.  --------------------------------------------------------------------------------------------------------------- Urine analysis:    Component Value Date/Time   COLORURINE YELLOW 07/30/2019 2313   APPEARANCEUR CLEAR 07/30/2019 2313   LABSPEC 1.011 07/30/2019 2313   PHURINE 9.0 (H) 07/30/2019 2313   GLUCOSEU NEGATIVE 07/30/2019 2313   HGBUR NEGATIVE 07/30/2019 2313   BILIRUBINUR NEGATIVE 07/30/2019 2313   KETONESUR NEGATIVE 07/30/2019 2313   PROTEINUR NEGATIVE 07/30/2019 2313    NITRITE NEGATIVE 07/30/2019 2313   LEUKOCYTESUR NEGATIVE 07/30/2019 2313      Imaging Results:    Dg Chest Port 1 View  Result Date: 08/15/2019 CLINICAL DATA:  Low blood pressure EXAM: PORTABLE CHEST 1 VIEW COMPARISON:  07/30/2019, 08/27/2017 FINDINGS: Tip of the tracheostomy tube appears at the thoracic inlet, slightly prominent convex distension of the tracheostomy balloon at the thoracic inlet. Marked cardiomegaly without overt failure. No large effusion. No focal consolidation. IMPRESSION: 1. Tracheostomy tube tip at the thoracic inlet with slight convex lucency around the tip suggesting prominent distention of tracheostomy balloon. 2. Cardiomegaly without edema or infiltrate. Electronically Signed   By: Donavan Foil M.D.   On: 08/15/2019 18:30   EKG afib at 70, nl axis, hint of biphasic t in v4-6   Assessment & Plan:    Principal Problem:   Hypotension Active Problems:   Symptomatic anemia   ARF (acute renal failure) (HCC)   Hyperkalemia  Hypotension Tele Trop I q3h x2 Check cortisol Check cardiac echo  Symptomatic anemia  , h/o AVM Transfuse 1 unit prbc Cont ferrous sulfate Check cbc in am Consider GI consultation in am  ARF Hydrate with ns iv Check cmp in am  Hyperkalemia Calcium gluconate 1gm iv x1 Kayexalate 15gm po x1 Check bmp Check cmp in am  Pafib Not on Eliquis due to prior admission for anemia Decrease Carvedilol 12.5mg - > 6.25mg  po bid Cont Cardeizem po bid   Hypothyroidism Cont Levothyroxine 100 micrograms po qday  Gerd Cont PPI  Dm2 fsbs ac and qhs, ISS  Diarrhea Check GI pathogen panel Check C. Diff Decrease magnesium oxide 400mg  bid-> qday Cont Imodium   Chronic Respiratory Failure  Consult PCCM regarding vent management, I called [facility]   DVT Prophylaxis-    SCDs     AM Labs Ordered, also please review Full Orders  Family Communication: Admission, patients condition and plan of care including tests being ordered have been  discussed with the patient  who indicate understanding and agree with the plan and Code Status.  Code Status:  FULL CODE, spoke with patient, she wants to be [place] with son and updated him that patient will be admitted [facility]  Admission status: Observation: Based on patients clinical presentation and evaluation of above clinical data, I have made determination that patient meets observation criteria at this time.     Time spent in minutes : 70   Jani Gravel M.D on 08/15/2019 at 7:01 PM

## 2019-08-15 NOTE — ED Triage Notes (Signed)
Pt here from [place] for hypotension-per staff 75/40 @ 1400 today with multiple consistent readings. EMS called, pt normotensive but staff still wanted to transport. Pt normotensive with [facility] for entirety. Pt ambulatory at facility prior to leaving. Pt ambulated to bedside commode, had bowel movement, and ambulated to [facility] stretcher without difficulty.

## 2019-08-16 ENCOUNTER — Observation Stay (HOSPITAL_BASED_OUTPATIENT_CLINIC_OR_DEPARTMENT_OTHER): Payer: Medicare Other

## 2019-08-16 DIAGNOSIS — Z7901 Long term (current) use of anticoagulants: Secondary | ICD-10-CM | POA: Diagnosis not present

## 2019-08-16 DIAGNOSIS — N179 Acute kidney failure, unspecified: Secondary | ICD-10-CM | POA: Diagnosis present

## 2019-08-16 DIAGNOSIS — K219 Gastro-esophageal reflux disease without esophagitis: Secondary | ICD-10-CM | POA: Diagnosis present

## 2019-08-16 DIAGNOSIS — I480 Paroxysmal atrial fibrillation: Secondary | ICD-10-CM | POA: Diagnosis present

## 2019-08-16 DIAGNOSIS — E1122 Type 2 diabetes mellitus with diabetic chronic kidney disease: Secondary | ICD-10-CM | POA: Diagnosis present

## 2019-08-16 DIAGNOSIS — I5032 Chronic diastolic (congestive) heart failure: Secondary | ICD-10-CM | POA: Diagnosis present

## 2019-08-16 DIAGNOSIS — J9610 Chronic respiratory failure, unspecified whether with hypoxia or hypercapnia: Secondary | ICD-10-CM | POA: Diagnosis not present

## 2019-08-16 DIAGNOSIS — E039 Hypothyroidism, unspecified: Secondary | ICD-10-CM | POA: Diagnosis present

## 2019-08-16 DIAGNOSIS — Z79890 Hormone replacement therapy (postmenopausal): Secondary | ICD-10-CM | POA: Diagnosis not present

## 2019-08-16 DIAGNOSIS — I959 Hypotension, unspecified: Secondary | ICD-10-CM | POA: Diagnosis present

## 2019-08-16 DIAGNOSIS — E875 Hyperkalemia: Secondary | ICD-10-CM | POA: Diagnosis present

## 2019-08-16 DIAGNOSIS — I361 Nonrheumatic tricuspid (valve) insufficiency: Secondary | ICD-10-CM

## 2019-08-16 DIAGNOSIS — I340 Nonrheumatic mitral (valve) insufficiency: Secondary | ICD-10-CM | POA: Diagnosis not present

## 2019-08-16 DIAGNOSIS — L89152 Pressure ulcer of sacral region, stage 2: Secondary | ICD-10-CM | POA: Diagnosis present

## 2019-08-16 DIAGNOSIS — Z930 Tracheostomy status: Secondary | ICD-10-CM | POA: Diagnosis not present

## 2019-08-16 DIAGNOSIS — N184 Chronic kidney disease, stage 4 (severe): Secondary | ICD-10-CM | POA: Diagnosis present

## 2019-08-16 DIAGNOSIS — J9611 Chronic respiratory failure with hypoxia: Secondary | ICD-10-CM

## 2019-08-16 DIAGNOSIS — J9612 Chronic respiratory failure with hypercapnia: Secondary | ICD-10-CM | POA: Diagnosis not present

## 2019-08-16 DIAGNOSIS — E860 Dehydration: Secondary | ICD-10-CM | POA: Diagnosis present

## 2019-08-16 DIAGNOSIS — I130 Hypertensive heart and chronic kidney disease with heart failure and stage 1 through stage 4 chronic kidney disease, or unspecified chronic kidney disease: Secondary | ICD-10-CM | POA: Diagnosis present

## 2019-08-16 DIAGNOSIS — B351 Tinea unguium: Secondary | ICD-10-CM | POA: Diagnosis present

## 2019-08-16 DIAGNOSIS — Z931 Gastrostomy status: Secondary | ICD-10-CM | POA: Diagnosis not present

## 2019-08-16 DIAGNOSIS — R451 Restlessness and agitation: Secondary | ICD-10-CM | POA: Diagnosis present

## 2019-08-16 DIAGNOSIS — K529 Noninfective gastroenteritis and colitis, unspecified: Secondary | ICD-10-CM | POA: Diagnosis present

## 2019-08-16 DIAGNOSIS — Z6835 Body mass index (BMI) 35.0-35.9, adult: Secondary | ICD-10-CM | POA: Diagnosis not present

## 2019-08-16 DIAGNOSIS — D649 Anemia, unspecified: Secondary | ICD-10-CM | POA: Diagnosis present

## 2019-08-16 DIAGNOSIS — E662 Morbid (severe) obesity with alveolar hypoventilation: Secondary | ICD-10-CM | POA: Diagnosis present

## 2019-08-16 DIAGNOSIS — Z20828 Contact with and (suspected) exposure to other viral communicable diseases: Secondary | ICD-10-CM | POA: Diagnosis present

## 2019-08-16 DIAGNOSIS — Z430 Encounter for attention to tracheostomy: Secondary | ICD-10-CM

## 2019-08-16 LAB — TYPE AND SCREEN
ABO/RH(D): O POS
Antibody Screen: NEGATIVE
Unit division: 0

## 2019-08-16 LAB — PHOSPHORUS: Phosphorus: 5.6 mg/dL — ABNORMAL HIGH (ref 2.5–4.6)

## 2019-08-16 LAB — COMPREHENSIVE METABOLIC PANEL
ALT: 15 U/L (ref 0–44)
AST: 23 U/L (ref 15–41)
Albumin: 2.4 g/dL — ABNORMAL LOW (ref 3.5–5.0)
Alkaline Phosphatase: 270 U/L — ABNORMAL HIGH (ref 38–126)
Anion gap: 12 (ref 5–15)
BUN: 114 mg/dL — ABNORMAL HIGH (ref 8–23)
CO2: 23 mmol/L (ref 22–32)
Calcium: 9.3 mg/dL (ref 8.9–10.3)
Chloride: 101 mmol/L (ref 98–111)
Creatinine, Ser: 4.18 mg/dL — ABNORMAL HIGH (ref 0.44–1.00)
GFR calc Af Amer: 11 mL/min — ABNORMAL LOW (ref >60)
GFR calc non Af Amer: 10 mL/min — ABNORMAL LOW (ref >60)
Glucose, Bld: 86 mg/dL (ref 70–99)
Potassium: 4.1 mmol/L (ref 3.5–5.1)
Sodium: 136 mmol/L (ref 135–145)
Total Bilirubin: 0.8 mg/dL (ref 0.3–1.2)
Total Protein: 7 g/dL (ref 6.5–8.1)

## 2019-08-16 LAB — CBC
HCT: 25.6 % — ABNORMAL LOW (ref 36.0–46.0)
Hemoglobin: 8.1 g/dL — ABNORMAL LOW (ref 12.0–15.0)
MCH: 27.9 pg (ref 26.0–34.0)
MCHC: 31.6 g/dL (ref 30.0–36.0)
MCV: 88.3 fL (ref 80.0–100.0)
Platelets: 154 10*3/uL (ref 150–400)
RBC: 2.9 MIL/uL — ABNORMAL LOW (ref 3.87–5.11)
RDW: 16.6 % — ABNORMAL HIGH (ref 11.5–15.5)
WBC: 5.8 10*3/uL (ref 4.0–10.5)
nRBC: 0 % (ref 0.0–0.2)

## 2019-08-16 LAB — TROPONIN I (HIGH SENSITIVITY)
Troponin I (High Sensitivity): 16 ng/L (ref <18)
Troponin I (High Sensitivity): 16 ng/L (ref <18)

## 2019-08-16 LAB — ECHOCARDIOGRAM COMPLETE
Height: 61 in
Weight: 2987.67 oz

## 2019-08-16 LAB — GLUCOSE, CAPILLARY
Glucose-Capillary: 81 mg/dL (ref 70–99)
Glucose-Capillary: 88 mg/dL (ref 70–99)
Glucose-Capillary: 78 mg/dL (ref 70–99)
Glucose-Capillary: 90 mg/dL (ref 70–99)
Glucose-Capillary: 105 mg/dL — ABNORMAL HIGH (ref 70–99)

## 2019-08-16 LAB — BPAM RBC
Blood Product Expiration Date: 202010052359
ISSUE DATE / TIME: 202009040108
Unit Type and Rh: 5100

## 2019-08-16 LAB — SARS CORONAVIRUS 2 (TAT 6-24 HRS): SARS Coronavirus 2: NEGATIVE

## 2019-08-16 LAB — MAGNESIUM: Magnesium: 2.4 mg/dL (ref 1.7–2.4)

## 2019-08-16 LAB — CORTISOL: Cortisol, Plasma: 5.5 ug/dL

## 2019-08-16 MED ORDER — ORAL CARE MOUTH RINSE
15.0000 mL | OROMUCOSAL | Status: DC
  Administered 2019-08-16 – 2019-08-20 (×21): 15 mL via OROMUCOSAL

## 2019-08-16 MED ORDER — ACETAMINOPHEN 325 MG PO TABS
650.0000 mg | ORAL_TABLET | Freq: Four times a day (QID) | ORAL | Status: DC | PRN
  Administered 2019-08-19: 650 mg via JEJUNOSTOMY
  Filled 2019-08-16: qty 2

## 2019-08-16 MED ORDER — VITAMIN D 25 MCG (1000 UNIT) PO TABS
1000.0000 [IU] | ORAL_TABLET | Freq: Every day | ORAL | Status: DC
  Administered 2019-08-17 – 2019-08-20 (×4): 1000 [IU] via JEJUNOSTOMY
  Filled 2019-08-16 (×4): qty 1

## 2019-08-16 MED ORDER — CARVEDILOL 6.25 MG PO TABS
6.2500 mg | ORAL_TABLET | Freq: Two times a day (BID) | ORAL | Status: DC
  Administered 2019-08-16 – 2019-08-20 (×8): 6.25 mg via JEJUNOSTOMY
  Filled 2019-08-16 (×8): qty 1

## 2019-08-16 MED ORDER — PANTOPRAZOLE SODIUM 40 MG IV SOLR
40.0000 mg | Freq: Two times a day (BID) | INTRAVENOUS | Status: DC
  Administered 2019-08-16 – 2019-08-20 (×9): 40 mg via INTRAVENOUS
  Filled 2019-08-16 (×9): qty 40

## 2019-08-16 MED ORDER — INSULIN ASPART 100 UNIT/ML ~~LOC~~ SOLN
0.0000 [IU] | SUBCUTANEOUS | Status: DC
  Administered 2019-08-18 – 2019-08-20 (×2): 1 [IU] via SUBCUTANEOUS

## 2019-08-16 MED ORDER — SODIUM CHLORIDE 0.9 % IV SOLN
INTRAVENOUS | Status: DC
  Administered 2019-08-16 – 2019-08-19 (×5): via INTRAVENOUS

## 2019-08-16 MED ORDER — PRO-STAT SUGAR FREE PO LIQD: 30.0000 mL | Freq: Two times a day (BID) | ORAL | Status: DC

## 2019-08-16 MED ORDER — MELATONIN 3 MG PO TABS
1.0000 | ORAL_TABLET | Freq: Every day | ORAL | Status: DC
  Administered 2019-08-16 – 2019-08-19 (×4): 3 mg via JEJUNOSTOMY
  Filled 2019-08-16 (×5): qty 1

## 2019-08-16 MED ORDER — LEVOTHYROXINE SODIUM 100 MCG PO TABS
100.0000 ug | ORAL_TABLET | Freq: Every day | ORAL | Status: DC
  Administered 2019-08-17 – 2019-08-20 (×4): 100 ug via JEJUNOSTOMY
  Filled 2019-08-16 (×4): qty 1

## 2019-08-16 MED ORDER — CHLORHEXIDINE GLUCONATE 0.12% ORAL RINSE (MEDLINE KIT)
15.0000 mL | Freq: Two times a day (BID) | OROMUCOSAL | Status: DC
  Administered 2019-08-16 – 2019-08-20 (×7): 15 mL via OROMUCOSAL

## 2019-08-16 MED ORDER — FOLIC ACID 1 MG PO TABS
1.0000 mg | ORAL_TABLET | Freq: Every day | ORAL | Status: DC
  Administered 2019-08-17 – 2019-08-20 (×4): 1 mg
  Filled 2019-08-16 (×4): qty 1

## 2019-08-16 MED ORDER — ADULT MULTIVITAMIN W/MINERALS CH
1.0000 | ORAL_TABLET | Freq: Every day | ORAL | Status: DC
  Administered 2019-08-17 – 2019-08-20 (×4): 1
  Filled 2019-08-16 (×4): qty 1

## 2019-08-16 MED ORDER — ACETAMINOPHEN 650 MG RE SUPP: 650.0000 mg | Freq: Four times a day (QID) | RECTAL | Status: DC | PRN

## 2019-08-16 MED ORDER — DILTIAZEM HCL 60 MG PO TABS
30.0000 mg | ORAL_TABLET | Freq: Two times a day (BID) | ORAL | Status: DC
  Administered 2019-08-16 – 2019-08-20 (×8): 30 mg via JEJUNOSTOMY
  Filled 2019-08-16 (×8): qty 1

## 2019-08-16 MED ORDER — SERTRALINE HCL 100 MG PO TABS
100.0000 mg | ORAL_TABLET | Freq: Every day | ORAL | Status: DC
  Administered 2019-08-17 – 2019-08-20 (×4): 100 mg via JEJUNOSTOMY
  Filled 2019-08-16 (×4): qty 1

## 2019-08-16 MED ORDER — LORATADINE 10 MG PO TABS
10.0000 mg | ORAL_TABLET | Freq: Every day | ORAL | Status: DC
  Administered 2019-08-17 – 2019-08-20 (×4): 10 mg via JEJUNOSTOMY
  Filled 2019-08-16 (×4): qty 1

## 2019-08-16 MED ORDER — VITAL HIGH PROTEIN PO LIQD
1000.0000 mL | ORAL | Status: DC
  Administered 2019-08-16 – 2019-08-18 (×3): 1000 mL

## 2019-08-16 MED ORDER — LOPERAMIDE HCL 1 MG/7.5ML PO SUSP
2.0000 mg | ORAL | Status: DC | PRN
  Filled 2019-08-16: qty 15

## 2019-08-16 MED ORDER — ALBUTEROL SULFATE (2.5 MG/3ML) 0.083% IN NEBU: 2.5000 mg | INHALATION_SOLUTION | RESPIRATORY_TRACT | Status: DC | PRN

## 2019-08-16 NOTE — Progress Notes (Signed)
[facility] Physician-Brief Progress Note Patient Name: Shirley Malone DOB: 05-29-1941 MRN: [id number]   Date of Service  08/16/2019  HPI/Events of Note  Notified by RN that patient is intubated and on tube feeds.  Insulin ordered AC and HS.  eICU Interventions  Check glucose q4hrs.  Insulin sliding scale frequency changed to q4 hours.       Intervention Category Minor Interventions: Other:  Elsie Lincoln 08/16/2019, 8:07 PM

## 2019-08-16 NOTE — Consult Note (Signed)
NAME:  Shirley Malone, MRN:  [id number], DOB:  01/05/1941, LOS: 0 ADMISSION DATE:  08/15/2019, CONSULTATION DATE:  07/30/19 REFERRING MD:  Stark Jock  CHIEF COMPLAINT:  Vent Management   Brief History   Shirley Malone is a 78 y.o. female with chronic trach / PEG who resides at [facility], admitted 9/3 with hypotension. Reportedly BP 75/40 per [employer]. Hemoglobin 7.3. Transfusing one unit RBC. PCCM consulted for vent management.    History of present illness   Shirley Malone is a 78 y.o. female who has a PMH including but not limited to chronic respiratory failure s/p trach and who resides at [facility], tracheobronchomalacia, A.fib (on apixaban), dCHF, DVT RLE s/p anticoagulation and IVC filter, GI bleeding, anemia, CKD, obesity, hypothyroidism, DM2.  She was admitted to [facility] in May 2020 for RLL pseudomonas PNA and E.coli UTI (s/p 7 days merrem), RLE DVT s/p anticoagulation and IVC filter, recurrent GI bleed (Notes state she was placed on heparin and transitioned to low dose 2.5mg  apixaban BID as she has hx GI bleeding with higher doses of anticoagulation.  Baseline Hgb around 7 - 8.5).  She was discharged to [facility] on 05/31/19.    Recent Admission 8/18-8/20 with acute anemia. Given 2 units RBC, Gi Consulted. Suspected AVM. Held Eliquis. Transferred back to [facility].   Past Medical History  Chronic respiratory failure s/p trach and who resides at [facility], tracheobronchomalacia, A.fib (on apixaban), dCHF, DVT RLE s/p anticoagulation and IVC filter, GI bleeding, anemia, CKD, obesity, hypothyroidism, DM2  Significant Hospital Events   9/3 > admit.  Consults:  PCCM.  Procedures:  S/P trach and peg  Significant Diagnostic Tests:  None.  Micro Data:  SARS CoV2 9/3 > negative   Antimicrobials:  None.   Interim history/subjective:   Objective:  Blood pressure 125/75, pulse 81, temperature 97.9 F (36.6 C), temperature source Oral, resp. rate (!) 32, height 5\' 1"  (1.549 m),  weight 84.5 kg, SpO2 99 %.    Vent Mode: PRVC FiO2 (%):  [40 %] 40 % Set Rate:  [16 bmp] 16 bmp Vt Set:  [550 mL] 550 mL PEEP:  [5 cmH20] 5 cmH20 Plateau Pressure:  [7 cmH20-17 cmH20] 7 cmH20   Intake/Output Summary (Last 24 hours) at 08/16/2019 0326 Last data filed at 08/16/2019 0121 Gross per 24 hour  Intake 13 ml  Output --  Net 13 ml   Filed Weights   08/15/19 2200  Weight: 84.5 kg    Examination: General:  Elderly female, no distress HEENT: MMM. Trach in place  Neuro: Alert, oriented, follows commands  CV: RRR, no MRG  PULM:  Rhonchi, no wheeze, non-labored  GI: Soft, active bowel sounds, PEG in place  Extremities: -edema  Skin: intact   Assessment & Plan:   Chronic respiratory failure - s/p trach.  Plan  -Continue Current Vent Setting > 40%/5 PEEP  -Pulmonary Hygiene  -Routine Trach Care  -Duonebs prn  -Trend CXR  Chronic Anemia with no overt signs of bleeding. -not on anticoagulation due to h/o bleeding  Plan -S/P 1 unit RBC -Trend CBC   Rest per primary team.  PCCM will see 1 - 2x per week for trach needs.  Please call if needed sooner.  Best Practice:  Diet: NPO VAP protocol (if indicated): yes GI prophylaxis: PPI  Glucose control: trend  Mobility: BR Code Status: Full  Family Communication: no family at bedside.   Disposition: continue progressive care  Labs   CBC: Recent Labs  Lab 08/15/19 1659  WBC  7.5  HGB 7.3*  HCT 23.5*  MCV 90.0  PLT [id number]   Basic Metabolic Panel: Recent Labs  Lab 08/15/19 1659  NA 135  K 5.4*  CL 100  CO2 23  GLUCOSE 102*  BUN 121*  CREATININE 4.36*  CALCIUM 9.1   GFR: Estimated Creatinine Clearance: 10.8 mL/min (A) (by C-G formula based on SCr of 4.36 mg/dL (H)). Recent Labs  Lab 08/15/19 1659  WBC 7.5   Liver Function Tests: No results for input(s): AST, ALT, ALKPHOS, BILITOT, PROT, ALBUMIN in the last 168 hours. No results for input(s): LIPASE, AMYLASE in the last 168 hours. No results for  input(s): AMMONIA in the last 168 hours. ABG    Component Value Date/Time   PHART 7.432 07/30/2019 2138   PCO2ART 44.1 07/30/2019 2138   PO2ART 125 (H) 07/30/2019 2138   HCO3 28.9 (H) 07/30/2019 2138   TCO2 23 07/04/2019 0103   ACIDBASEDEF 6.0 (H) 07/04/2019 0103   O2SAT 98.7 07/30/2019 2138    Coagulation Profile: No results for input(s): INR, PROTIME in the last 168 hours. Cardiac Enzymes: No results for input(s): CKTOTAL, CKMB, CKMBINDEX, TROPONINI in the last 168 hours. HbA1C: Hgb A1c MFr Bld  Date/Time Value Ref Range Status  07/28/2017 04:11 PM 5.8 (H) 4.8 - 5.6 % Final    Comment:    (NOTE) Pre diabetes:          5.7%-6.4% Diabetes:              >6.4% Glycemic control for   <7.0% adults with diabetes    CBG: Recent Labs  Lab 08/15/19 2139  GLUCAP 84    Review of Systems:   unable  Past medical history  She,  has a past medical history of Anemia, Atrial fibrillation ([place]), Chronic kidney disease, Chronic respiratory failure ([place]), Diabetes mellitus without complication ([place]), DVT (deep venous thrombosis) ([place]), Gastrointestinal bleed, Morbid obesity ([place]), Obesity hypoventilation syndrome ([place]), and Thyroid disease.   Surgical History   unable  Social History   reports that she has never smoked. She has never used smokeless tobacco. She reports previous alcohol use. She reports previous drug use.   Family history   Her family history is not on file.   Allergies Allergies  Allergen Reactions   Clarithromycin Itching and Rash   Codeine Itching    Other reaction(s): Unknown   Latex Hives    Other reaction(s): Other Blisters    Other     adhesive on tape     Home meds  Prior to Admission medications   Not on File   Hayden Pedro, AGACNP-BC [place] Pgr: [phone]

## 2019-08-16 NOTE — Progress Notes (Signed)
Initial Nutrition Assessment  DOCUMENTATION CODES:   Obesity unspecified  INTERVENTION:   Initiate Vital High Protein @ 45 ml/hr via j-port  D/c 30 ml Prostat BID.    Tube feeding regimen provides 1080 kcal (100% of needs), 95 grams of protein, and 903 ml of H2O.   NUTRITION DIAGNOSIS:   Inadequate oral intake related to inability to eat as evidenced by NPO status.  GOAL:   Provide needs based on ASPEN/SCCM guidelines  MONITOR:   Vent status, Labs, Weight trends, Skin, I & O's  REASON FOR ASSESSMENT:   Ventilator    ASSESSMENT:   Shirley Malone  is a 78 y.o. female,  w hypertension, Dm2, ckd stage 4, Pafib, h/o DVT, apparently presents with c/o hypotension per [facility] 75/40.  Pt's lowest bp documented in computer 106/58.  Pt noted slight diarrhea (4x per day, somewhat chronic),  dyspnea, but denies fever, chills, cough, cp, palp,n/v, abd pain, brbpr.  Patient is currently on ventilator support via trach.  MV: 8.2 L/min Temp (24hrs), Avg:98 F (36.7 C), Min:97.1 F (36.2 C), Max:99 F (37.2 C)  Reviewed I/O's: +439 ml x 24 hours  UOP: 500 ml x 24 hours   MAP: 116  Pt on enteric precautions. RD did not enter room in an effort to preserve PPE.   Pt is a resident at [facility]. At baseline, on trach collar during the day and vent at night. She has a PEG-J. Reviewed notes from [place]; apparently pt is on TF PTA, however, only 30 ml Prostat BID listed per MAR. Per MD notes, pt with chronic diarrhea and requesting RD re-evaluate TF regimen.   RD will d/c Prostat, due to it containing sugar alcohols which may be exacerbating GI distress. RD will trial a semi-elemental formula (Vital) in order to promote tolerance. Vital products contain hydrolyzed, peptide-based system, MCT/fish oil structured lipids, scFOS to promote tolerance and absorption.  Reviewed wt hx; pt with 5.6% wt loss x 2 weeks.   Medications reviewed and include folic acid, vitamin D3, melatonin, and  0.9% soidum chloride infusion @ 75 ml/hr.   Labs reviewed: CBGS: 81 (inpatient orders for glycemic control are 0-5 units insulin aspart q HS and 0-9 units insulin aspart TID with meals).   Diet Order:   Diet Order            Diet heart healthy/carb modified Room service appropriate? Yes; Fluid consistency: Thin  Diet effective now              EDUCATION NEEDS:   Not appropriate for education at this time  Skin:  Skin Assessment: Skin Integrity Issues: Skin Integrity Issues:: Stage II Stage II: sacrum  Last BM:  Unknown  Height:   Ht Readings from Last 1 Encounters:  08/15/19 5\' 1"  (1.549 m)    Weight:   Wt Readings from Last 1 Encounters:  08/16/19 84.7 kg    Ideal Body Weight:  47.7 kg  BMI:  Body mass index is 35.28 kg/m.  Estimated Nutritional Needs:   Kcal:  [phone]  Protein:  > 95 grams  Fluid:  > 1.2 L    Racine Erby A. Jimmye Norman, RD, LDN, [place] Registered Dietitian II Certified Diabetes Care and Education Specialist Pager: [phone] After hours Pager: [phone]

## 2019-08-16 NOTE — Progress Notes (Signed)
PROGRESS NOTE    Shirley Malone  DJM:[id number] DOB: 10-09-1941 DOA: 08/15/2019 PCP: Townsend Roger, MD   Brief Narrative: Per HPI:78 y.o. female,  w hypertension, Dm2, ckd stage 4, Pafib, h/o DVT, apparently presents with c/o hypotension per [facility] 75/40.  Pt's lowest bp documented in computer 106/58.  Pt noted slight diarrhea (4x per day, somewhat chronic),  dyspnea, but denies fever, chills, cough, cp, palp,n/v, abd pain, brbpr.     In ED, T 97.4, P 61  R 16, Bp 133/81  Pox 100% on vent, Na 135, K 5.4, Bun 121, Creatinine 4.36 Wbc 7.5, Hgb 7.3, Plt 228, FOBT +. covid -19 neg CXR " Tracheostomy tube tip at the thoracic inlet with slight convex lucency around the tip suggesting prominent distention of tracheostomy balloon.Cardiomegaly without edema or infiltrate" Transfusion of 1 unit prbc ordered by ED,  She was admitted for hypotension, symptomatic anemia, and mild ARF on chronic renal failure  Subjective: Seen/examined No complaints. Is alert,awake, follows commands, no abdominal pain, chest pain. Overnight BP stable, very little stool sample to check On vent since admission- appears she is on during night tmax 99, wbc 5.8k, hb up at 8.1gm-post 1 unit prbc  Assessment & Plan:   Hypotension:unclear etiology-apparently episode of hypotension at [facility]- but normal when EMS arrived. On coreg at 12.5 mg bid-cut down to 625 twice daily and also on Cardizem 30 twice daily and will be continued.  Work up with cortisol-nl, echo pending.  Symptomatic anemia:Hb up at 8.1gm-post 1 unit prbc. FOBT+ stool, Has hx of GIB in 2016- s/p EGD/COLO- CAPSULE- without clear cause- recent admission in august for same and egd/colo was not performed.  Continue to monitor  ARF on CKD IV/V: baseline creat at 3.3 in august, Creat now at 4.1 from 4.3, BUN at 115 (baseline 80s), likley multifactorial from volume loss from chronic diarrhea, Hypotension. I will keep her on ivf as BUN is significantly up.  Repeat Labs in am, monitor uop.  Hyperkalemia:improved to 4.1  Diarrhea-somewhat chronic per hpi- pt has c diff/GIP ordered on admission. D/c magox. likley contributing to volume loss and AKI.  DM2: Blood sugar stable, monitor Accu-Chek.  Chronic resp failue w hypoxia on chronic vent- nocturnal-managed by PCCM  Hypothyroidism-cont Synthroid.  Nutrition: PEG + , nutrition consulted for refeeding  Pressure Ulcer: Pressure Injury 08/15/19 Sacrum Medial Stage II -  Partial thickness loss of dermis presenting as a shallow open ulcer with a red, pink wound bed without slough. small shallow open area on sacrum (Active)  08/15/19 2200  Location: Sacrum  Location Orientation: Medial  Staging: Stage II -  Partial thickness loss of dermis presenting as a shallow open ulcer with a red, pink wound bed without slough.  Wound Description (Comments): small shallow open area on sacrum  Present on Admission: Yes    Body mass index is 35.28 kg/m.    DVT prophylaxis: SCD Code Status: FULL CODE Family Communication: plan of care discussed with patient in detail. Will update family Disposition Plan: Patient continues to need IV fluid hydration to manage her AKI, we will move her inpatient  status    Consultants:  pccm  Procedures: None  Microbiology: COVID-19 negative  Antimicrobials: Anti-infectives (From admission, onward)   None       Objective: Vitals:   08/16/19 0419 08/16/19 0500 08/16/19 0600 08/16/19 0810  BP:  132/73 (!) 152/94   Pulse:  74 75 83  Resp:  _0 Temp: 98 F (36.7  C)     TempSrc: Oral     SpO2:  99% 100% 100%  Weight:  84.7 kg    Height:        Intake/Output Summary (Last 24 hours) at 08/16/2019 0834 Last data filed at 08/16/2019 0643 Gross per 24 hour  Intake 939.12 ml  Output 500 ml  Net 439.12 ml   Filed Weights   08/15/19 2200 08/16/19 0500  Weight: 84.5 kg 84.7 kg   Weight change:   Body mass index is 35.28 kg/m.  Intake/Output from  previous day: 09/03 0701 - 09/04 0700 In: 939.1 [I.V.:392.4; Blood:496.7; IV Piggyback:50.1] Out: 500 [Urine:500] Intake/Output this shift: No intake/output data recorded.  Examination:  General exam: Appears calm and comfortable,Not in distress, older fore the age HEENT:PERRL,Oral mucosa moist, Ear/Nose normal on gross exam Respiratory system: Bilateral equal air entry, normal vesicular breath sounds, no wheezes or crackles  Cardiovascular system: S1 & S2 heard,No JVD, murmurs. Gastrointestinal system: Abdomen is  soft, non tender, non distended, BS +  Nervous System:Alert and oriented. No focal neurological deficits/moving extremities, sensation intact. Extremities: No edema, no clubbing, distal peripheral pulses palpable. Skin: No rashes, lesions, no icterus MSK: Normal muscle bulk,tone ,power  Medications:  Scheduled Meds: . acidophilus  1 capsule Oral BID  . carvedilol  6.25 mg Oral BID WC  . chlorhexidine  15 mL Mouth/Throat BID  . chlorhexidine gluconate (MEDLINE KIT)  15 mL Mouth Rinse BID  . Chlorhexidine Gluconate Cloth  6 each Topical Daily  . cholecalciferol  1,000 Units Oral Daily  . diltiazem  30 mg Oral BID  . feeding supplement (PRO-STAT SUGAR FREE 64)  30 mL Oral Q12H  . ferrous sulfate  325 mg Oral Q breakfast  . folic acid  1 mg Oral Daily  . insulin aspart  0-5 Units Subcutaneous QHS  . insulin aspart  0-9 Units Subcutaneous TID WC  . levothyroxine  100 mcg Oral QAC breakfast  . loperamide  2 mg Oral Daily  . loratadine  10 mg Oral Daily  . magnesium oxide  400 mg Oral Daily  . mouth rinse  15 mL Mouth Rinse 10 times per day  . Melatonin  1 tablet Oral QHS  . multivitamin with minerals  1 tablet Oral Daily  . pantoprazole  40 mg Oral BID  . phenol  1 spray Mouth/Throat Q12H  . sertraline  100 mg Oral Daily  . sodium chloride flush  3 mL Intravenous Q12H   Continuous Infusions: . sodium chloride    . sodium chloride      Data Reviewed: I have  personally reviewed following labs and imaging studies  CBC: Recent Labs  Lab 08/15/19 1659 08/16/19 0658  WBC 7.5 5.8  HGB 7.3* 8.1*  HCT 23.5* 25.6*  MCV 90.0 88.3  PLT 228 010   Basic Metabolic Panel: Recent Labs  Lab 08/15/19 1659 08/16/19 0658  NA 135 136  K 5.4* 4.1  CL 100 101  CO2 23 23  GLUCOSE 102* 86  BUN 121* 114*  CREATININE 4.36* 4.18*  CALCIUM 9.1 9.3   GFR: Estimated Creatinine Clearance: 11.3 mL/min (A) (by C-G formula based on SCr of 4.18 mg/dL (H)). Liver Function Tests: Recent Labs  Lab 08/16/19 0658  AST 23  ALT 15  ALKPHOS 270*  BILITOT 0.8  PROT 7.0  ALBUMIN 2.4*   No results for input(s): LIPASE, AMYLASE in the last 168 hours. No results for input(s): AMMONIA in the last 168 hours. Coagulation Profile:  No results for input(s): INR, PROTIME in the last 168 hours. Cardiac Enzymes: No results for input(s): CKTOTAL, CKMB, CKMBINDEX, TROPONINI in the last 168 hours. BNP (last 3 results) No results for input(s): PROBNP in the last 8760 hours. HbA1C: No results for input(s): HGBA1C in the last 72 hours. CBG: Recent Labs  Lab 08/15/19 2139 08/16/19 0810  GLUCAP 84 81   Lipid Profile: No results for input(s): CHOL, HDL, LDLCALC, TRIG, CHOLHDL, LDLDIRECT in the last 72 hours. Thyroid Function Tests: No results for input(s): TSH, T4TOTAL, FREET4, T3FREE, THYROIDAB in the last 72 hours. Anemia Panel: No results for input(s): VITAMINB12, FOLATE, FERRITIN, TIBC, IRON, RETICCTPCT in the last 72 hours. Sepsis Labs: No results for input(s): PROCALCITON, LATICACIDVEN in the last 168 hours.  Recent Results (from the past 240 hour(s))  SARS CORONAVIRUS 2 (TAT 6-24 HRS) Nasopharyngeal Nasopharyngeal Swab     Status: None   Collection Time: 08/15/19  5:41 PM   Specimen: Nasopharyngeal Swab  Result Value Ref Range Status   SARS Coronavirus 2 NEGATIVE NEGATIVE Final    Comment: (NOTE) SARS-CoV-2 target nucleic acids are NOT DETECTED. The  SARS-CoV-2 RNA is generally detectable in upper and lower respiratory specimens during the acute phase of infection. Negative results do not preclude SARS-CoV-2 infection, do not rule out co-infections with other pathogens, and should not be used as the sole basis for treatment or other patient management decisions. Negative results must be combined with clinical observations, patient history, and epidemiological information. The expected result is Negative. Fact Sheet for Patients: SugarRoll.be Fact Sheet for Healthcare Providers: https://www.woods-mathews.com/ This test is not yet approved or cleared by the [place] FDA and  has been authorized for detection and/or diagnosis of SARS-CoV-2 by FDA under an Emergency Use Authorization (EUA). This EUA will remain  in effect (meaning this test can be used) for the duration of the COVID-19 declaration under Section 56 4(b)(1) of the Act, 21 U.S.C. section 360bbb-3(b)(1), unless the authorization is terminated or revoked sooner. Performed at [facility], [place] [address], [place], [place] [address]       Radiology Studies: Dg Chest Port 1 View  Result Date: 08/15/2019 CLINICAL DATA:  Low blood pressure EXAM: PORTABLE CHEST 1 VIEW COMPARISON:  07/30/2019, 08/27/2017 FINDINGS: Tip of the tracheostomy tube appears at the thoracic inlet, slightly prominent convex distension of the tracheostomy balloon at the thoracic inlet. Marked cardiomegaly without overt failure. No large effusion. No focal consolidation. IMPRESSION: 1. Tracheostomy tube tip at the thoracic inlet with slight convex lucency around the tip suggesting prominent distention of tracheostomy balloon. 2. Cardiomegaly without edema or infiltrate. Electronically Signed   By: Donavan Foil M.D.   On: 08/15/2019 18:30      LOS: 0 days   Time spent: More than 50% of that time was spent in counseling and/or coordination of care.   Antonieta Pert, MD [facility]  08/16/2019, 8:34 AM

## 2019-08-16 NOTE — Progress Notes (Signed)
  Echocardiogram 2D Echocardiogram has been performed.  Shirley Malone 08/16/2019, 10:58 AM

## 2019-08-17 DIAGNOSIS — L8990 Pressure ulcer of unspecified site, unspecified stage: Secondary | ICD-10-CM | POA: Insufficient documentation

## 2019-08-17 DIAGNOSIS — L89152 Pressure ulcer of sacral region, stage 2: Secondary | ICD-10-CM | POA: Diagnosis not present

## 2019-08-17 DIAGNOSIS — I959 Hypotension, unspecified: Secondary | ICD-10-CM | POA: Diagnosis not present

## 2019-08-17 DIAGNOSIS — R451 Restlessness and agitation: Secondary | ICD-10-CM

## 2019-08-17 DIAGNOSIS — N179 Acute kidney failure, unspecified: Secondary | ICD-10-CM | POA: Diagnosis not present

## 2019-08-17 DIAGNOSIS — D649 Anemia, unspecified: Secondary | ICD-10-CM | POA: Diagnosis not present

## 2019-08-17 LAB — GLUCOSE, CAPILLARY
Glucose-Capillary: 100 mg/dL — ABNORMAL HIGH (ref 70–99)
Glucose-Capillary: 97 mg/dL (ref 70–99)
Glucose-Capillary: 103 mg/dL — ABNORMAL HIGH (ref 70–99)
Glucose-Capillary: 104 mg/dL — ABNORMAL HIGH (ref 70–99)
Glucose-Capillary: 110 mg/dL — ABNORMAL HIGH (ref 70–99)

## 2019-08-17 LAB — TYPE AND SCREEN
ABO/RH(D): O POS
Antibody Screen: NEGATIVE
Unit division: 0

## 2019-08-17 LAB — BPAM RBC
Blood Product Expiration Date: 202010052359
ISSUE DATE / TIME: 202009040108
Unit Type and Rh: 5100

## 2019-08-17 LAB — BASIC METABOLIC PANEL
Anion gap: 12 (ref 5–15)
BUN: 111 mg/dL — ABNORMAL HIGH (ref 8–23)
CO2: 21 mmol/L — ABNORMAL LOW (ref 22–32)
Calcium: 8.8 mg/dL — ABNORMAL LOW (ref 8.9–10.3)
Chloride: 106 mmol/L (ref 98–111)
Creatinine, Ser: 3.91 mg/dL — ABNORMAL HIGH (ref 0.44–1.00)
GFR calc Af Amer: 12 mL/min — ABNORMAL LOW (ref >60)
GFR calc non Af Amer: 11 mL/min — ABNORMAL LOW (ref >60)
Glucose, Bld: 104 mg/dL — ABNORMAL HIGH (ref 70–99)
Potassium: 4.1 mmol/L (ref 3.5–5.1)
Sodium: 139 mmol/L (ref 135–145)

## 2019-08-17 LAB — CBC
HCT: 23.9 % — ABNORMAL LOW (ref 36.0–46.0)
Hemoglobin: 7.6 g/dL — ABNORMAL LOW (ref 12.0–15.0)
MCH: 27.9 pg (ref 26.0–34.0)
MCHC: 31.8 g/dL (ref 30.0–36.0)
MCV: 87.9 fL (ref 80.0–100.0)
Platelets: 192 10*3/uL (ref 150–400)
RBC: 2.72 MIL/uL — ABNORMAL LOW (ref 3.87–5.11)
RDW: 16.4 % — ABNORMAL HIGH (ref 11.5–15.5)
WBC: 6 10*3/uL (ref 4.0–10.5)
nRBC: 0 % (ref 0.0–0.2)

## 2019-08-17 LAB — MAGNESIUM
Magnesium: 2.3 mg/dL (ref 1.7–2.4)
Magnesium: 2.2 mg/dL (ref 1.7–2.4)

## 2019-08-17 LAB — PHOSPHORUS
Phosphorus: 5.4 mg/dL — ABNORMAL HIGH (ref 2.5–4.6)
Phosphorus: 4.8 mg/dL — ABNORMAL HIGH (ref 2.5–4.6)

## 2019-08-17 MED ORDER — HYDROXYZINE HCL 10 MG/5ML PO SYRP
25.0000 mg | ORAL_SOLUTION | Freq: Three times a day (TID) | ORAL | Status: DC | PRN
  Administered 2019-08-17 – 2019-08-20 (×7): 25 mg via ORAL
  Filled 2019-08-17 (×9): qty 12.5

## 2019-08-17 MED ORDER — HALOPERIDOL LACTATE 5 MG/ML IJ SOLN
1.0000 mg | Freq: Four times a day (QID) | INTRAMUSCULAR | Status: DC | PRN
  Administered 2019-08-17: 1 mg via INTRAVENOUS
  Filled 2019-08-17: qty 1

## 2019-08-17 NOTE — Progress Notes (Signed)
Order for patient to be on vent QHS. Pt does not want to go on vent at this time. RN aware.

## 2019-08-17 NOTE — Progress Notes (Signed)
Assisted tele visit to patient with son.  Shirley Malone M, RN  

## 2019-08-17 NOTE — Progress Notes (Signed)
Pt is refusing to wear trach collar, get suctioned, and to wear her electrodes.  Pt is mouthing "No", slapping my hand, and throwing suction equipment across room when this RN attempts to do pt care.  Jorge Mandril RRT and Dr. Earnest Conroy witnessed pt refusal.  Educated pt on risks of refusal of care.  Will continue to encourage pt participation in care.  Irven Baltimore, RN

## 2019-08-17 NOTE — Progress Notes (Signed)
Pt pulled her telemetry electrodes off and refuses to let this writer put back on.  Informed Dr. Earnest Conroy as well as charge RN Helene Kelp.  Haldol ordered. Irven Baltimore, RN

## 2019-08-17 NOTE — Progress Notes (Signed)
While attempting to put trach collar on pt, pt became very combative, slapping at this Probation officer and the RN.  Pt is continually refusing any care, assistance or intervention from staff.  Pt has been throwing items across the room and threatened to spit on the RN.  Attending physician, Dr. Earnest Conroy, witnessed some of this behavior and D. Tuchman, RN, bore the brunt of the negative behavior.  Staff has continually asked the pt to allow [place] to assist her but continually refuses.

## 2019-08-17 NOTE — Progress Notes (Signed)
PROGRESS NOTE    Shirley Malone  MRN:[id number]  DOB: 05/27/1941  DOA: 08/15/2019 PCP: Van Eyk, Jason, MD  Brief Narrative:  78 y.o.female,w hypertension, Dm2, ckd stage 4, Pafib, h/o DVT, apparently presents with c/o hypotension per [facility] 75/40. Pt's lowest bp documented in computer 106/58. Pt noted slight diarrhea (4x per day, somewhat chronic), dyspnea, but denies fever, chills, cough, cp, palp,n/v, abd pain, brbpr.  In ED, T 97.4, P 61 R 16, Bp 133/81 Pox 100% on vent, Na 135, K 5.4, Bun 121, Creatinine 4.36, Wbc 7.5, Hgb 7.3, Plt 228, FOBT +. covid -19 neg CXR " Tracheostomy tube tip at the thoracic inlet with slight convex lucency around the tip suggesting prominent distention of tracheostomy balloon.Cardiomegaly without edema or infiltrate"Transfusion of 1 unit prbc ordered by ED, She was admitted for hypotension, symptomatic anemia, and mild ARF on chronic renal failure with PCCM consultation for vent management (on vent at night at baseline)  Subjective:  Patient agitated and has been refusing suctioning by RT and bedside nurse.  She however appears to have thick tracheal secretions which she was noted to be coughing up and pulling out with her hand.  Not sure if she has borderline personality disorder at baseline or this is new change  Objective: Vitals:   08/17/19 1514 08/17/19 1600 08/17/19 1800 08/17/19 1828  BP:  (!) 111/56 101/72   Pulse:  92 100   Resp:   (!) 26   Temp:    98.6 F (37 C)  TempSrc:    Oral  SpO2: 92% (!) 89% 90%   Weight:      Height:        Intake/Output Summary (Last 24 hours) at 08/17/2019 2028 Last data filed at 08/17/2019 1800 Gross per 24 hour  Intake 2624.57 ml  Output 1601 ml  Net 1023.57 ml   Filed Weights   08/15/19 2200 08/16/19 0500 08/17/19 0500  Weight: 84.5 kg 84.7 kg 85.2 kg    Physical Examination:  General exam: Appears to be oriented although whispers answers and tries to communicate by writing alphabets on  the bed but refuses to write on paper Respiratory system: Status post tracheostomy with trach collar during the day.  Upper airway secretions as discussed above, no increased work of breathing except during coughing.  Refusing suctioning cardiovascular system: S1 & S2 heard, RRR. No JVD, murmurs, rubs, gallops or clicks. No pedal edema. Gastrointestinal system: Abdomen is nondistended, soft and nontender. No organomegaly or masses felt. Normal bowel sounds heard. Central nervous system: Alert and likely oriented but cannot confirm. No focal neurological deficits, moving all extremities. Extremities: Symmetric 5 x 5 power.  No edema Skin: Stage II sacral pressure ulcer Psychiatry: Judgement and insight appear impaired. Mood & affect anxious/agitated    Data Reviewed: I have personally reviewed following labs and imaging studies  CBC: Recent Labs  Lab 08/15/19 1659 08/16/19 0658 08/17/19 0459  WBC 7.5 5.8 6.0  HGB 7.3* 8.1* 7.6*  HCT 23.5* 25.6* 23.9*  MCV 90.0 88.3 87.9  PLT 228 154 192   Basic Metabolic Panel: Recent Labs  Lab 08/15/19 1659 08/16/19 0658 08/16/19 1539 08/17/19 0459  NA 135 136  --  139  K 5.4* 4.1  --  4.1  CL 100 101  --  106  CO2 23 23  --  21*  GLUCOSE 102* 86  --  104*  BUN 121* 114*  --  111*  CREATININE 4.36* 4.18*  --  3.91*  CALCIUM 9.1   9.3  --  8.8*  MG  --   --  2.4 2.3  PHOS  --   --  5.6* 5.4*   GFR: Estimated Creatinine Clearance: 12.1 mL/min (A) (by C-G formula based on SCr of 3.91 mg/dL (H)). Liver Function Tests: Recent Labs  Lab 08/16/19 0658  AST 23  ALT 15  ALKPHOS 270*  BILITOT 0.8  PROT 7.0  ALBUMIN 2.4*   No results for input(s): LIPASE, AMYLASE in the last 168 hours. No results for input(s): AMMONIA in the last 168 hours. Coagulation Profile: No results for input(s): INR, PROTIME in the last 168 hours. Cardiac Enzymes: No results for input(s): CKTOTAL, CKMB, CKMBINDEX, TROPONINI in the last 168 hours. BNP (last 3  results) No results for input(s): PROBNP in the last 8760 hours. HbA1C: No results for input(s): HGBA1C in the last 72 hours. CBG: Recent Labs  Lab 08/17/19 0330 08/17/19 0853 08/17/19 1154 08/17/19 1826 08/17/19 1956  GLUCAP 100* 97 103* 104* 110*   Lipid Profile: No results for input(s): CHOL, HDL, LDLCALC, TRIG, CHOLHDL, LDLDIRECT in the last 72 hours. Thyroid Function Tests: No results for input(s): TSH, T4TOTAL, FREET4, T3FREE, THYROIDAB in the last 72 hours. Anemia Panel: No results for input(s): VITAMINB12, FOLATE, FERRITIN, TIBC, IRON, RETICCTPCT in the last 72 hours. Sepsis Labs: No results for input(s): PROCALCITON, LATICACIDVEN in the last 168 hours.  Recent Results (from the past 240 hour(s))  SARS CORONAVIRUS 2 (TAT 6-24 HRS) Nasopharyngeal Nasopharyngeal Swab     Status: None   Collection Time: 08/15/19  5:41 PM   Specimen: Nasopharyngeal Swab  Result Value Ref Range Status   SARS Coronavirus 2 NEGATIVE NEGATIVE Final    Comment: (NOTE) SARS-CoV-2 target nucleic acids are NOT DETECTED. The SARS-CoV-2 RNA is generally detectable in upper and lower respiratory specimens during the acute phase of infection. Negative results do not preclude SARS-CoV-2 infection, do not rule out co-infections with other pathogens, and should not be used as the sole basis for treatment or other patient management decisions. Negative results must be combined with clinical observations, patient history, and epidemiological information. The expected result is Negative. Fact Sheet for Patients: https://www.fda.gov/media/138098/download Fact Sheet for Healthcare Providers: https://www.fda.gov/media/138095/download This test is not yet approved or cleared by the [place] FDA and  has been authorized for detection and/or diagnosis of SARS-CoV-2 by FDA under an Emergency Use Authorization (EUA). This EUA will remain  in effect (meaning this test can be used) for the duration of the  COVID-19 declaration under Section 56 4(b)(1) of the Act, 21 U.S.C. section 360bbb-3(b)(1), unless the authorization is terminated or revoked sooner. Performed at [facility], [address] [place], [place] [address]       Radiology Studies: No results found.      Scheduled Meds: . carvedilol  6.25 mg Per J Tube BID WC  . chlorhexidine gluconate (MEDLINE KIT)  15 mL Mouth Rinse BID  . Chlorhexidine Gluconate Cloth  6 each Topical Daily  . cholecalciferol  1,000 Units Per J Tube Daily  . diltiazem  30 mg Per J Tube BID  . folic acid  1 mg Per Tube Daily  . insulin aspart  0-9 Units Subcutaneous Q4H  . levothyroxine  100 mcg Per J Tube QAC breakfast  . loratadine  10 mg Per J Tube Daily  . mouth rinse  15 mL Mouth Rinse 10 times per day  . Melatonin  1 tablet Per J Tube QHS  . multivitamin with minerals    1 tablet Per Tube Daily  . pantoprazole (PROTONIX) IV  40 mg Intravenous Q12H  . phenol  1 spray Mouth/Throat Q12H  . sertraline  100 mg Per J Tube Daily  . sodium chloride flush  3 mL Intravenous Q12H   Continuous Infusions: . sodium chloride Stopped (08/16/19 0940)  . sodium chloride    . sodium chloride 75 mL/hr at 08/17/19 1800  . feeding supplement (VITAL HIGH PROTEIN) 1,000 mL (08/17/19 1849)    Assessment & Plan:   Hypotension:unclear etiology-apparently episode of hypotension at [facility]- but normal when EMS arrived. On coreg at 12.5 mg bid-cut down to 625 twice daily and also on Cardizem 30 twice daily and will be continued.  Work up with cortisol-nl, echo shows LVEF 55-60% mild LVH, normal wall motion, mildly dilated RV, mild LAE, moderate to severe RAE, MV thickening with possible AMVL prolapse and moderate posterior/laterally directed regurgitation, aortic valve sclerosis.  Symptomatic anemia:Hb up at 8.1gm-post 1 unit prbc. FOBT+ stool, Has hx of GIB in 2016- s/p extensive work-up including upper/lower endoscopy and capsule endoscopy- without clear  cause.  Also had recent admission in august for same and egd/colo was not performed.  Continue to monitor.  Follow-up with GI as outpatient  ARF on CKD IV/V: baseline creat at 3.3 in august, Creat now at 4.1 from 4.3, BUN at 115 (baseline 80s), likley multifactorial from volume loss from chronic diarrhea, Hypotension.  Continue IV hydration as BUN is significantly up. Repeat Labs in am, monitor uop.  Hyperkalemia improved.  Agitation/refusal of treatment: Patient fighting RN/respiratory therapist and not allowing tracheal suction.  She threw the suction tubing on the floor as well as the pen and paper that was given to communicate on the floor.  She may have uremic encephalopathy related agitation.  Will try and communicate with [facility] staff in a.m to get an idea about her baseline status.Haldol as needed agitation ordered for now (QTC 450 ms).  Diarrhea-somewhat chronic per hpi- pt has c diff/GIP ordered on admission. D/c magox. likley contributing to volume loss and AKI.  DM2: Blood sugar stable, monitor Accu-Chek.  Chronic resp failue w hypoxia on chronic vent- nocturnal-managed by PCCM  Hypothyroidism-cont Synthroid.  Nutrition: PEG + , nutrition consulted    DVT prophylaxis: SCDs in the setting of anemia and concern for GIB Code Status: Full code per record Family / Patient Communication: No family bedside.  Will contact [facility] in a.m. Disposition Plan: Back to [facility] in next 48-72  hours     LOS: 1 day    Time spent: 35 minutes    Guilford Shi, MD [facility] Pager [phone]  If 7PM-7AM, please contact night-coverage [email or web address] Password [facility] 08/17/2019, 8:28 PM

## 2019-08-18 DIAGNOSIS — I959 Hypotension, unspecified: Secondary | ICD-10-CM | POA: Diagnosis not present

## 2019-08-18 DIAGNOSIS — L89152 Pressure ulcer of sacral region, stage 2: Secondary | ICD-10-CM | POA: Diagnosis not present

## 2019-08-18 DIAGNOSIS — J9610 Chronic respiratory failure, unspecified whether with hypoxia or hypercapnia: Secondary | ICD-10-CM

## 2019-08-18 DIAGNOSIS — Z930 Tracheostomy status: Secondary | ICD-10-CM | POA: Diagnosis not present

## 2019-08-18 DIAGNOSIS — D649 Anemia, unspecified: Secondary | ICD-10-CM | POA: Diagnosis not present

## 2019-08-18 DIAGNOSIS — N179 Acute kidney failure, unspecified: Secondary | ICD-10-CM | POA: Diagnosis not present

## 2019-08-18 LAB — BASIC METABOLIC PANEL
Anion gap: 9 (ref 5–15)
BUN: 102 mg/dL — ABNORMAL HIGH (ref 8–23)
CO2: 22 mmol/L (ref 22–32)
Calcium: 8.7 mg/dL — ABNORMAL LOW (ref 8.9–10.3)
Chloride: 111 mmol/L (ref 98–111)
Creatinine, Ser: 3.72 mg/dL — ABNORMAL HIGH (ref 0.44–1.00)
GFR calc Af Amer: 13 mL/min — ABNORMAL LOW (ref >60)
GFR calc non Af Amer: 11 mL/min — ABNORMAL LOW (ref >60)
Glucose, Bld: 117 mg/dL — ABNORMAL HIGH (ref 70–99)
Potassium: 4.2 mmol/L (ref 3.5–5.1)
Sodium: 142 mmol/L (ref 135–145)

## 2019-08-18 LAB — GLUCOSE, CAPILLARY
Glucose-Capillary: 101 mg/dL — ABNORMAL HIGH (ref 70–99)
Glucose-Capillary: 102 mg/dL — ABNORMAL HIGH (ref 70–99)
Glucose-Capillary: 110 mg/dL — ABNORMAL HIGH (ref 70–99)
Glucose-Capillary: 111 mg/dL — ABNORMAL HIGH (ref 70–99)
Glucose-Capillary: 114 mg/dL — ABNORMAL HIGH (ref 70–99)
Glucose-Capillary: 124 mg/dL — ABNORMAL HIGH (ref 70–99)

## 2019-08-18 LAB — PHOSPHORUS: Phosphorus: 4.2 mg/dL (ref 2.5–4.6)

## 2019-08-18 LAB — MAGNESIUM: Magnesium: 2.2 mg/dL (ref 1.7–2.4)

## 2019-08-18 NOTE — Progress Notes (Signed)
This writer went in room and pt had trach ties completley off.  Educated pt on risks of trach coming out and importance of keeping the trach ties on, immediately replaced/retied trach ties. Pt said she understood and is A&Ox4.  Will monitor closely.  Irven Baltimore, RN

## 2019-08-18 NOTE — Progress Notes (Signed)
PROGRESS NOTE    Shirley Malone  VOP:[id number]  DOB: 10-Mar-1941  DOA: 08/15/2019 PCP: Townsend Roger, MD  Brief Narrative:  78 y.o.female,w hypertension, Dm2, ckd stage 4, Pafib, h/o DVT, apparently presents with c/o hypotension per [facility] 75/40. Pt's lowest bp documented in computer 106/58. Pt noted slight diarrhea (4x per day, somewhat chronic), dyspnea, but denies fever, chills, cough, cp, palp,n/v, abd pain, brbpr.  In ED, T 97.4, P 61 R 16, Bp 133/81 Pox 100% on vent, Na 135, K 5.4, Bun 121, Creatinine 4.36, Wbc 7.5, Hgb 7.3, Plt 228, FOBT +. covid -19 neg CXR " Tracheostomy tube tip at the thoracic inlet with slight convex lucency around the tip suggesting prominent distention of tracheostomy balloon.Cardiomegaly without edema or infiltrate"Transfusion of 1 unit prbc ordered by ED, She was admitted for hypotension, symptomatic anemia, and mild ARF on chronic renal failure with PCCM consultation for vent management (on vent at night at baseline)  Subjective:  Patient more cooperative today with medical treatment.  Upper airway secretions improved as she has been compliant with tracheal suctioning by bedside nurse.  I did contact [facility] and was informed by Caryl Pina RN who is well versed with this patient that she usually resides on the long-term side ([address] [place]).  She apparently does have borderline personality disorder and tends to get agitated.  She had dislodged PICC line sent tracheostomy so on several occasions.  Patient apparently on continuous vent at [facility]  Objective: Vitals:   08/18/19 1600 08/18/19 1700 08/18/19 1703 08/18/19 1800  BP: 125/74 122/83  111/70  Pulse: 91   79  Resp: (!) 30 20  (!) 25  Temp:   98.6 F (37 C)   TempSrc:      SpO2: (!) 86%   100%  Weight:      Height:        Intake/Output Summary (Last 24 hours) at 08/18/2019 1829 Last data filed at 08/18/2019 1800 Gross per 24 hour  Intake 2876.7 ml  Output 1600 ml  Net 1276.7 ml   Filed  Weights   08/16/19 0500 08/17/19 0500 08/18/19 0500  Weight: 84.7 kg 85.2 kg 88 kg    Physical Examination:  General exam: Sleeping comfortably, did not want to answer questions Respiratory system: Status post tracheostomy with trach collar during the day.  Upper airway secretions improved, no increased work of breathing except during coughing.  Refusing suctioning cardiovascular system: S1 & S2 heard, RRR. No JVD, murmurs, rubs, gallops or clicks. No pedal edema. Gastrointestinal system: Abdomen is nondistended, soft and nontender. No organomegaly or masses felt. Normal bowel sounds heard. Central nervous system: Alert and likely oriented but cannot confirm. No focal neurological deficits, moving all extremities. Extremities: Symmetric 5 x 5 power.  No edema Skin: Stage II sacral pressure ulcer Psychiatry: Judgement and insight appear impaired. Mood & affect anxious/agitated intermittently    Data Reviewed: I have personally reviewed following labs and imaging studies  CBC: Recent Labs  Lab 08/15/19 1659 08/16/19 0658 08/17/19 0459  WBC 7.5 5.8 6.0  HGB 7.3* 8.1* 7.6*  HCT 23.5* 25.6* 23.9*  MCV 90.0 88.3 87.9  PLT 228 154 638   Basic Metabolic Panel: Recent Labs  Lab 08/15/19 1659 08/16/19 0658 08/16/19 1539 08/17/19 0459 08/17/19 1958 08/18/19 0652  NA 135 136  --  139  --  142  K 5.4* 4.1  --  4.1  --  4.2  CL 100 101  --  106  --  111  CO2  23 23  --  21*  --  22  GLUCOSE 102* 86  --  104*  --  117*  BUN 121* 114*  --  111*  --  102*  CREATININE 4.36* 4.18*  --  3.91*  --  3.72*  CALCIUM 9.1 9.3  --  8.8*  --  8.7*  MG  --   --  2.4 2.3 2.2 2.2  PHOS  --   --  5.6* 5.4* 4.8* 4.2   GFR: Estimated Creatinine Clearance: 13 mL/min (A) (by C-G formula based on SCr of 3.72 mg/dL (H)). Liver Function Tests: Recent Labs  Lab 08/16/19 0658  AST 23  ALT 15  ALKPHOS 270*  BILITOT 0.8  PROT 7.0  ALBUMIN 2.4*   No results for input(s): LIPASE, AMYLASE in the  last 168 hours. No results for input(s): AMMONIA in the last 168 hours. Coagulation Profile: No results for input(s): INR, PROTIME in the last 168 hours. Cardiac Enzymes: No results for input(s): CKTOTAL, CKMB, CKMBINDEX, TROPONINI in the last 168 hours. BNP (last 3 results) No results for input(s): PROBNP in the last 8760 hours. HbA1C: No results for input(s): HGBA1C in the last 72 hours. CBG: Recent Labs  Lab 08/18/19 0013 08/18/19 0400 08/18/19 0727 08/18/19 1156 08/18/19 1617  GLUCAP 111* 101* 114* 110* 124*   Lipid Profile: No results for input(s): CHOL, HDL, LDLCALC, TRIG, CHOLHDL, LDLDIRECT in the last 72 hours. Thyroid Function Tests: No results for input(s): TSH, T4TOTAL, FREET4, T3FREE, THYROIDAB in the last 72 hours. Anemia Panel: No results for input(s): VITAMINB12, FOLATE, FERRITIN, TIBC, IRON, RETICCTPCT in the last 72 hours. Sepsis Labs: No results for input(s): PROCALCITON, LATICACIDVEN in the last 168 hours.  Recent Results (from the past 240 hour(s))  SARS CORONAVIRUS 2 (TAT 6-24 HRS) Nasopharyngeal Nasopharyngeal Swab     Status: None   Collection Time: 08/15/19  5:41 PM   Specimen: Nasopharyngeal Swab  Result Value Ref Range Status   SARS Coronavirus 2 NEGATIVE NEGATIVE Final    Comment: (NOTE) SARS-CoV-2 target nucleic acids are NOT DETECTED. The SARS-CoV-2 RNA is generally detectable in upper and lower respiratory specimens during the acute phase of infection. Negative results do not preclude SARS-CoV-2 infection, do not rule out co-infections with other pathogens, and should not be used as the sole basis for treatment or other patient management decisions. Negative results must be combined with clinical observations, patient history, and epidemiological information. The expected result is Negative. Fact Sheet for Patients: SugarRoll.be Fact Sheet for Healthcare Providers: https://www.woods-mathews.com/  This test is not yet approved or cleared by the [place] FDA and  has been authorized for detection and/or diagnosis of SARS-CoV-2 by FDA under an Emergency Use Authorization (EUA). This EUA will remain  in effect (meaning this test can be used) for the duration of the COVID-19 declaration under Section 56 4(b)(1) of the Act, 21 U.S.C. section 360bbb-3(b)(1), unless the authorization is terminated or revoked sooner. Performed at [facility], [place] [address] [place], [place] [address]       Radiology Studies: No results found.      Scheduled Meds: . carvedilol  6.25 mg Per J Tube BID WC  . chlorhexidine gluconate (MEDLINE KIT)  15 mL Mouth Rinse BID  . Chlorhexidine Gluconate Cloth  6 each Topical Daily  . cholecalciferol  1,000 Units Per J Tube Daily  . diltiazem  30 mg Per J Tube BID  . folic acid  1 mg Per Tube Daily  .  insulin aspart  0-9 Units Subcutaneous Q4H  . levothyroxine  100 mcg Per J Tube QAC breakfast  . loratadine  10 mg Per J Tube Daily  . mouth rinse  15 mL Mouth Rinse 10 times per day  . Melatonin  1 tablet Per J Tube QHS  . multivitamin with minerals  1 tablet Per Tube Daily  . pantoprazole (PROTONIX) IV  40 mg Intravenous Q12H  . phenol  1 spray Mouth/Throat Q12H  . sertraline  100 mg Per J Tube Daily  . sodium chloride flush  3 mL Intravenous Q12H   Continuous Infusions: . sodium chloride    . sodium chloride 75 mL/hr at 08/18/19 1800  . feeding supplement (VITAL HIGH PROTEIN) 1,000 mL (08/18/19 1805)    Assessment & Plan:   Hypotension:unclear etiology although could have been secondary to dehydration-apparently episode of hypotension at [facility]- but normal when EMS arrived. On coreg at 12.5 mg bid-cut down to 625 twice daily and also on Cardizem 30 twice daily and will be continued.  Work up with cortisol-nl, echo shows LVEF 55-60% mild LVH, normal wall motion, mildly dilated RV, mild LAE, moderate to severe RAE, MV thickening with  possible AMVL prolapse and moderate posterior/laterally directed regurgitation, aortic valve sclerosis.  Follow-up cardiology as outpatient  Symptomatic anemia:Hb up at 8.1gm-post 1 unit prbc. FOBT+ stool, Has hx of GIB in 2016- s/p extensive work-up including upper/lower endoscopy and capsule endoscopy- without clear cause.  Also had recent admission in august for same and egd/colo was not performed.  Continue to monitor.  No melena since being here.  Follow-up with GI as outpatient  ARF on CKD IV/V: baseline creat at 3.3 in august, Creat now at 4.1 from 4.3, BUN at 115 (baseline 80s), likley multifactorial from volume loss from chronic diarrhea, Hypotension.  Improving BUN with IV hydration although has high BUN even at baseline (90s).  Will continue hydration for another day as [facility] not able to accept her today.  Repeat Labs in am, monitor uop.  Hyperkalemia improved.  Agitation/refusal of treatment:  communicated with [facility] staff and this is apparently her baseline status.Haldol as needed agitation ordered for now (QTC 450 ms).  Diarrhea-somewhat chronic per hpi- pt has c diff/GIP ordered on admission. D/ced magox. likley contributed to volume loss, hypotension and AKI.  DM2: Blood sugar stable, monitor Accu-Chek.  Chronic resp failue w hypoxia on chronic vent-patient seen by PCCM who recommend trach collar during the day and vent at nigh-managed by PCCMt.  They are even recommending to taper off vent at night.  Can follow-up at [facility]  Hypothyroidism-cont Synthroid.  Nutrition: PEG + , nutrition consulted    DVT prophylaxis: SCDs in the setting of anemia and concern for GIB Code Status: Full code per record Family / Patient Communication: No family bedside.  Discussed with staff at primary facility as discussed above Disposition Plan: Back to [facility] in next 24 to 48 hours.  Per Caryl Pina, patient cannot be accepted today at [facility] as they do not have management staff to  approve.  Will order COVID screening test and request social worker to follow-up in a.m. for possible transfer back.     LOS: 2 days    Time spent: 35 minutes    Guilford Shi, MD [facility] Pager [phone]  If 7PM-7AM, please contact night-coverage [email or web address] Password [facility] 08/18/2019, 6:29 PM

## 2019-08-18 NOTE — Progress Notes (Addendum)
Reviewed plan of care with RT.  Patient does use ATC at [facility].  Orders adjusted for patient to wean during daytime on ATC as tolerated.  Full vent support QHS.    Noe Gens, NP-C [facility] Pgr: [phone] or if no answer [phone] 08/18/2019, 9:34 AM   She has chronic respiratory failure status post tracheostomy at [place], okay to proceed with trach collar daytime, vent at night. I am not sure that she even needs nocturnal ventilation and we can try to define that in the hospital but this would not hold up discharge back to [facility]. Elsworth Soho MD

## 2019-08-18 NOTE — Progress Notes (Deleted)
PHARMACIST - PHYSICIAN COMMUNICATION  DR:   Earnest Conroy  CONCERNING: IV to Oral Route Change Policy  RECOMMENDATION: This patient is receiving Protonix by the intravenous route.  Based on criteria approved by the Pharmacy and Therapeutics Committee, the intravenous medication(s) is/are being converted to the equivalent oral dose form(s).   DESCRIPTION: These criteria include:  The patient is eating (either orally or via tube) and/or has been taking other orally administered medications for a least 24 hours  The patient has no evidence of active gastrointestinal bleeding or impaired GI absorption (gastrectomy, short bowel, patient on TNA or NPO).  If you have questions about this conversion, please contact the Pharmacy Department  []   [phone] )  [facility] []   [phone] )  [facility] [x]   [phone] )  [facility] []   [phone] )  [facility] []   [phone] )  [place], [facility] 08/18/2019 1:17 PM

## 2019-08-19 DIAGNOSIS — N179 Acute kidney failure, unspecified: Secondary | ICD-10-CM | POA: Diagnosis not present

## 2019-08-19 DIAGNOSIS — I959 Hypotension, unspecified: Secondary | ICD-10-CM | POA: Diagnosis not present

## 2019-08-19 LAB — GLUCOSE, CAPILLARY
Glucose-Capillary: 100 mg/dL — ABNORMAL HIGH (ref 70–99)
Glucose-Capillary: 102 mg/dL — ABNORMAL HIGH (ref 70–99)
Glucose-Capillary: 108 mg/dL — ABNORMAL HIGH (ref 70–99)
Glucose-Capillary: 109 mg/dL — ABNORMAL HIGH (ref 70–99)
Glucose-Capillary: 111 mg/dL — ABNORMAL HIGH (ref 70–99)
Glucose-Capillary: 99 mg/dL (ref 70–99)

## 2019-08-19 LAB — BASIC METABOLIC PANEL
Anion gap: 7 (ref 5–15)
BUN: 89 mg/dL — ABNORMAL HIGH (ref 8–23)
CO2: 22 mmol/L (ref 22–32)
Calcium: 8.8 mg/dL — ABNORMAL LOW (ref 8.9–10.3)
Chloride: 115 mmol/L — ABNORMAL HIGH (ref 98–111)
Creatinine, Ser: 3.45 mg/dL — ABNORMAL HIGH (ref 0.44–1.00)
GFR calc Af Amer: 14 mL/min — ABNORMAL LOW (ref >60)
GFR calc non Af Amer: 12 mL/min — ABNORMAL LOW (ref >60)
Glucose, Bld: 106 mg/dL — ABNORMAL HIGH (ref 70–99)
Potassium: 4.2 mmol/L (ref 3.5–5.1)
Sodium: 144 mmol/L (ref 135–145)

## 2019-08-19 MED ORDER — CHLORHEXIDINE GLUCONATE 0.12 % MT SOLN
OROMUCOSAL | Status: AC
  Filled 2019-08-19: qty 15

## 2019-08-19 MED ORDER — CARVEDILOL 6.25 MG PO TABS: 6.2500 mg | ORAL_TABLET | Freq: Two times a day (BID) | ORAL | Status: AC

## 2019-08-19 NOTE — Discharge Summary (Signed)
Physician Discharge Summary  Shirley Malone [id number] DOB: 03/08/1941 DOA: 08/15/2019  PCP: Townsend Roger, MD  Admit date: 08/15/2019 Discharge date: 08/19/2019  [facility]  Discharge Condition:Stable CODE STATUS:FULL Diet recommendation: Tube feed Brief/Interim Summary:  Patient is a 78 y.o.female,w hypertension, Dm2, ckd stage 4, Pafib, h/o DVT, apparently presented with  hypotension per [facility] with BP of 75/40.  She was noted to have  diarrhea (4x per day, somewhat chronic), dyspnea, but denied fever, chills, cough, cp, palp,n/v, abd pain, brbpr.  Work up in the ED showed  T 97.4, P 61 R 16, Bp 133/81 Pox 100% on vent,Na 135, K 5.4, Bun 121, Creatinine 4.36, Wbc 7.5, Hgb 7.3, Plt 228,FOBT +.covid -19neg She wasadmitted for hypotension, symptomatic anemia, and mild ARF on chronic renal failure with PCCM consultation for vent management .  Her kidney function improved with IV fluids and currently is on baseline.  Blood pressure has improved and currently stable.  She is hemodynamically stable for discharge to [facility] as soon as the bed is available.  Following problems were addressed during her hospitalization:  Hypotension:unclear etiology although could have been secondary to dehydration-apparently episode of hypotension at [facility]- but normal when EMS arrived. On coreg at 12.5 mg bid-cut down to 6.25 twice daily and also on Cardizem 30 twice daily which  will be continued. Work up with cortisol-nl, echo shows LVEF 55-60% mild LVH, normal wall motion, mildly dilated RV, mild LAE, moderate to severe RAE, MV thickening with possible AMVL prolapse and moderate posterior/laterally directed regurgitation, aortic valve sclerosis.  Follow-up cardiology as outpatient  Symptomatic anemia:Hb in the range of 7-8-post 1 unit prbc. FOBT+ stool, Has hx of GIB - s/p extensive work-up including upper/lower endoscopy and capsule endoscopy- without clear  cause.  Also had recent admission in august for same and egd/colo was not performed.Continue to monitor H and H.  No melena since being here.  Follow-up with GI as outpatient  ARF on CKD IV/V: baseline creat at 3.3 in august, Creatinine up from [place], likley multifactorial from volume loss from chronic diarrhea, Hypotension.   Currently kidney function is at baseline.  Hyperkalemia improved.  Diarrhea-Chronic as per reports .She was on Magox which has been discontinued.  Currently she does not have any loose bowel movements.  DM2:Blood sugar stable,continue previous regimen.  Chronic resp failue w hypoxia on chronic vent-Stable  Hypothyroidism-contSynthroid.  Nutrition:Has PEG +  Chronic A. fib/DVT:She was On Eliquis which has been stopped .Rate well controlled. On Cardizem    Discharge Diagnoses:  Principal Problem:   Hypotension Active Problems:   Symptomatic anemia   ARF (acute renal failure) (HCC)   Hyperkalemia   AKI (acute kidney injury) ([place])   Pressure injury of skin    Discharge Instructions  Discharge Instructions    Diet - low sodium heart healthy   Complete by: As directed    Discharge instructions   Complete by: As directed    1)Check CBC and BMP test in a week.   Increase activity slowly   Complete by: As directed      Allergies as of 08/19/2019      Reactions   Clarithromycin Itching, Rash   Codeine Itching   Other reaction(s): Unknown   Latex Hives   Other reaction(s): Other Blisters   Other    adhesive on tape      Medication List    TAKE these medications   ACIDOPHILUS LACTOBACILLUS PO Take 1 tablet by mouth 2 (  two) times daily.   carvedilol 6.25 MG tablet Commonly known as: COREG 1 tablet (6.25 mg total) by Per J Tube route 2 (two) times daily with a meal. What changed:   medication strength  how much to take  how to take this  when to take this   cetirizine 10 MG tablet Commonly known as: ZYRTEC Take 10 mg  by mouth daily.   chlorhexidine 0.12 % solution Commonly known as: PERIDEX Use as directed 15 mLs in the mouth or throat 2 (two) times daily.   Darbepoetin Alfa 25 MCG/0.42ML Sosy injection Commonly known as: ARANESP Inject 25 mcg into the skin every 7 (seven) days.   diltiazem 30 MG tablet Commonly known as: CARDIZEM Take 30 mg by mouth 2 (two) times daily.   feeding supplement (PRO-STAT SUGAR FREE 64) Liqd Take 30 mLs by mouth every 12 (twelve) hours.   ferrous sulfate 325 (65 FE) MG tablet Take 325 mg by mouth daily with breakfast.   folic acid 1 MG tablet Commonly known as: FOLVITE Take 1 mg by mouth daily.   hydrOXYzine 25 MG tablet Commonly known as: ATARAX/VISTARIL Take 25 mg by mouth 3 (three) times daily as needed for itching.   levothyroxine 100 MCG tablet Commonly known as: SYNTHROID Take 100 mcg by mouth daily before breakfast.   loperamide 2 MG tablet Commonly known as: IMODIUM A-D Take 2 mg by mouth daily.   magnesium oxide 400 MG tablet Commonly known as: MAG-OX Take 400 mg by mouth every 12 (twelve) hours.   Melatonin 3 MG Tabs Take 1 tablet by mouth at bedtime.   multivitamin with minerals Tabs tablet Take 1 tablet by mouth daily.   pantoprazole 40 MG tablet Commonly known as: PROTONIX Take 1 tablet (40 mg total) by mouth 2 (two) times daily. What changed: when to take this   phenol 1.4 % Liqd Commonly known as: CHLORASEPTIC Use as directed 1 spray in the mouth or throat every 12 (twelve) hours.   sertraline 100 MG tablet Commonly known as: ZOLOFT Take 100 mg by mouth daily.   Vitamin D3 50 MCG (2000 UT) Tabs Take 1 tablet by mouth daily.       Allergies  Allergen Reactions  . Clarithromycin Itching and Rash  . Codeine Itching    Other reaction(s): Unknown  . Latex Hives    Other reaction(s): Other Blisters   . Other     adhesive on tape    Consultations:  PCCM   Procedures/Studies: Dg Chest Port 1 View  Result  Date: 08/15/2019 CLINICAL DATA:  Low blood pressure EXAM: PORTABLE CHEST 1 VIEW COMPARISON:  07/30/2019, 08/27/2017 FINDINGS: Tip of the tracheostomy tube appears at the thoracic inlet, slightly prominent convex distension of the tracheostomy balloon at the thoracic inlet. Marked cardiomegaly without overt failure. No large effusion. No focal consolidation. IMPRESSION: 1. Tracheostomy tube tip at the thoracic inlet with slight convex lucency around the tip suggesting prominent distention of tracheostomy balloon. 2. Cardiomegaly without edema or infiltrate. Electronically Signed   By: Donavan Foil M.D.   On: 08/15/2019 18:30   Dg Chest Port 1 View  Result Date: 07/31/2019 CLINICAL DATA:  Respiratory failure. EXAM: PORTABLE CHEST 1 VIEW COMPARISON:  08/27/2017 FINDINGS: Tracheostomy tube tip at the thoracic inlet. Cardiomegaly which appears similar to prior exam. Tortuous thoracic aorta. Hazy opacity at the right lung base consistent with pleural effusion and atelectasis/airspace disease. Vascular congestion without overt edema. No pneumothorax. Chronic change about both shoulders. IMPRESSION: 1.  Hazy opacity at the right lung base consistent with pleural effusion and atelectasis/airspace disease. Chronic cardiomegaly. Vascular congestion. 2. Tracheostomy tube tip at the thoracic inlet. Electronically Signed   By: Keith Rake M.D.   On: 07/31/2019 00:38       Subjective:  Patient seen and examined the bedside this morning.  Currently hemodynamically stable.  Blood pressure has remained stable. Hemodynamically stable  for discharge to [facility] .Called the son and updated the plan.  Discharge Exam: Vitals:   08/19/19 0900 08/19/19 1000  BP: (!) 133/94 116/81  Pulse: 74 72  Resp: (!) 22 (!) 22  Temp:    SpO2: 100% 95%   Vitals:   08/19/19 0800 08/19/19 0853 08/19/19 0900 08/19/19 1000  BP: (!) 141/95 (!) 128/99 (!) 133/94 116/81  Pulse: 86 80 74 72  Resp: (!) 23  (!) 22 (!) 22  Temp:       TempSrc:      SpO2: 92%  100% 95%  Weight:      Height:        General: Pt is alert, awake, not in acute distress Cardiovascular: RRR, S1/S2 +, no rubs, no gallops Respiratory: CTA bilaterally, no wheezing, no rhonchi Abdominal: Soft, NT, ND, bowel sounds + Extremities: no edema, no cyanosis    The results of significant diagnostics from this hospitalization (including imaging, microbiology, ancillary and laboratory) are listed below for reference.     Microbiology: Recent Results (from the past 240 hour(s))  SARS CORONAVIRUS 2 (TAT 6-24 HRS) Nasopharyngeal Nasopharyngeal Swab     Status: None   Collection Time: 08/15/19  5:41 PM   Specimen: Nasopharyngeal Swab  Result Value Ref Range Status   SARS Coronavirus 2 NEGATIVE NEGATIVE Final    Comment: (NOTE) SARS-CoV-2 target nucleic acids are NOT DETECTED. The SARS-CoV-2 RNA is generally detectable in upper and lower respiratory specimens during the acute phase of infection. Negative results do not preclude SARS-CoV-2 infection, do not rule out co-infections with other pathogens, and should not be used as the sole basis for treatment or other patient management decisions. Negative results must be combined with clinical observations, patient history, and epidemiological information. The expected result is Negative. Fact Sheet for Patients: SugarRoll.be Fact Sheet for Healthcare Providers: https://www.woods-mathews.com/ This test is not yet approved or cleared by the [place] FDA and  has been authorized for detection and/or diagnosis of SARS-CoV-2 by FDA under an Emergency Use Authorization (EUA). This EUA will remain  in effect (meaning this test can be used) for the duration of the COVID-19 declaration under Section 56 4(b)(1) of the Act, 21 U.S.C. section 360bbb-3(b)(1), unless the authorization is terminated or revoked sooner. Performed at [facility], [place]  [address], [place], [place] [address]      Labs: BNP (last 3 results) No results for input(s): BNP in the last 8760 hours. Basic Metabolic Panel: Recent Labs  Lab 08/15/19 1659 08/16/19 0658 08/16/19 1539 08/17/19 0459 08/17/19 1958 08/18/19 0652 08/19/19 0413  NA 135 136  --  139  --  142 144  K 5.4* 4.1  --  4.1  --  4.2 4.2  CL 100 101  --  106  --  111 115*  CO2 23 23  --  21*  --  22 22  GLUCOSE 102* 86  --  104*  --  117* 106*  BUN 121* 114*  --  111*  --  102* 89*  CREATININE 4.36* 4.18*  --  3.91*  --  3.72* 3.45*  CALCIUM 9.1 9.3  --  8.8*  --  8.7* 8.8*  MG  --   --  2.4 2.3 2.2 2.2  --   PHOS  --   --  5.6* 5.4* 4.8* 4.2  --    Liver Function Tests: Recent Labs  Lab 08/16/19 0658  AST 23  ALT 15  ALKPHOS 270*  BILITOT 0.8  PROT 7.0  ALBUMIN 2.4*   No results for input(s): LIPASE, AMYLASE in the last 168 hours. No results for input(s): AMMONIA in the last 168 hours. CBC: Recent Labs  Lab 08/15/19 1659 08/16/19 0658 08/17/19 0459  WBC 7.5 5.8 6.0  HGB 7.3* 8.1* 7.6*  HCT 23.5* 25.6* 23.9*  MCV 90.0 88.3 87.9  PLT 228 154 192   Cardiac Enzymes: No results for input(s): CKTOTAL, CKMB, CKMBINDEX, TROPONINI in the last 168 hours. BNP: Invalid input(s): POCBNP CBG: Recent Labs  Lab 08/18/19 1617 08/18/19 2101 08/19/19 0019 08/19/19 0320 08/19/19 0817  GLUCAP 124* 102* 108* 102* 99   D-Dimer No results for input(s): DDIMER in the last 72 hours. Hgb A1c No results for input(s): HGBA1C in the last 72 hours. Lipid Profile No results for input(s): CHOL, HDL, LDLCALC, TRIG, CHOLHDL, LDLDIRECT in the last 72 hours. Thyroid function studies No results for input(s): TSH, T4TOTAL, T3FREE, THYROIDAB in the last 72 hours.  Invalid input(s): FREET3 Anemia work up No results for input(s): VITAMINB12, FOLATE, FERRITIN, TIBC, IRON, RETICCTPCT in the last 72 hours. Urinalysis    Component Value Date/Time   COLORURINE YELLOW 07/30/2019 2313   APPEARANCEUR  CLEAR 07/30/2019 2313   LABSPEC 1.011 07/30/2019 2313   PHURINE 9.0 (H) 07/30/2019 2313   GLUCOSEU NEGATIVE 07/30/2019 2313   HGBUR NEGATIVE 07/30/2019 2313   BILIRUBINUR NEGATIVE 07/30/2019 2313   KETONESUR NEGATIVE 07/30/2019 2313   PROTEINUR NEGATIVE 07/30/2019 2313   NITRITE NEGATIVE 07/30/2019 2313   LEUKOCYTESUR NEGATIVE 07/30/2019 2313   Sepsis Labs Invalid input(s): PROCALCITONIN,  WBC,  LACTICIDVEN Microbiology Recent Results (from the past 240 hour(s))  SARS CORONAVIRUS 2 (TAT 6-24 HRS) Nasopharyngeal Nasopharyngeal Swab     Status: None   Collection Time: 08/15/19  5:41 PM   Specimen: Nasopharyngeal Swab  Result Value Ref Range Status   SARS Coronavirus 2 NEGATIVE NEGATIVE Final    Comment: (NOTE) SARS-CoV-2 target nucleic acids are NOT DETECTED. The SARS-CoV-2 RNA is generally detectable in upper and lower respiratory specimens during the acute phase of infection. Negative results do not preclude SARS-CoV-2 infection, do not rule out co-infections with other pathogens, and should not be used as the sole basis for treatment or other patient management decisions. Negative results must be combined with clinical observations, patient history, and epidemiological information. The expected result is Negative. Fact Sheet for Patients: SugarRoll.be Fact Sheet for Healthcare Providers: https://www.woods-mathews.com/ This test is not yet approved or cleared by the [place] FDA and  has been authorized for detection and/or diagnosis of SARS-CoV-2 by FDA under an Emergency Use Authorization (EUA). This EUA will remain  in effect (meaning this test can be used) for the duration of the COVID-19 declaration under Section 56 4(b)(1) of the Act, 21 U.S.C. section 360bbb-3(b)(1), unless the authorization is terminated or revoked sooner. Performed at [facility], [place] [address], [place], [place] [address]     Please  note: You were cared for by a hospitalist during your hospital stay. Once you are discharged, your primary care physician will handle any further medical issues. Please  note that NO REFILLS for any discharge medications will be authorized once you are discharged, as it is imperative that you return to your primary care physician (or establish a relationship with a primary care physician if you do not have one) for your post hospital discharge needs so that they can reassess your need for medications and monitor your lab values.    Time coordinating discharge: 40 minutes  SIGNED:   Shelly Coss, MD  [facility] 08/19/2019, 10:28 AM Pager [phone]  If 7PM-7AM, please contact night-coverage [email or web address] Password [email or web address]

## 2019-08-19 NOTE — Progress Notes (Signed)
Patient currently on 28% Trach collar, vitals are stable, patient resting comfortably. Will continue to monitor patient.

## 2019-08-19 NOTE — TOC Initial Note (Signed)
Transition of Care [facility]) - Initial/Assessment Note    Patient Details  Name: Shirley Malone MRN: [id number] Date of Birth: 20-Feb-1941  [facility]) CM/SW Contact:    Bartholomew Crews, RN Phone Number: [phone] 08/19/2019, 10:27 AM  Clinical Narrative:                 Received call from ICU nurse that patient was ready to transition back to [facility]. Able to verify that patient is from SNF. SNF unable to accept patient back today d/t holiday. Verified that patient's bed is still available. Covid test in process and will need to be completed prior to transition back.   Expected Discharge Plan: Long Term Nursing Home Barriers to Discharge: Other (comment)(covid test - bed available)   Patient Goals and CMS Choice        Expected Discharge Plan and Services Expected Discharge Plan: Long Term Nursing Home In-house Referral: NA Discharge Planning Services: NA                     DME Arranged: N/A DME Agency: NA       HH Arranged: NA HH Agency: NA        Prior Living Arrangements/Services   Lives with:: Facility Resident                   Activities of Daily Living      Permission Sought/Granted                  Emotional Assessment              Admission diagnosis:  AKI (acute kidney injury) ([place]) [N17.9] Chronic respiratory failure, unspecified whether with hypoxia or hypercapnia (HCC) [J96.10] Symptomatic anemia [D64.9] Gastrointestinal hemorrhage, unspecified gastrointestinal hemorrhage type [K92.2] Tracheostomy tube present ([place]) [Z93.0] Hypotension due to hypovolemia [I95.89, E86.1] Hypotension [I95.9] Patient Active Problem List   Diagnosis Date Noted  . Pressure injury of skin 08/17/2019  . AKI (acute kidney injury) ([place]) 08/16/2019  . Hypotension 08/15/2019  . ARF (acute renal failure) ([place]) 08/15/2019  . Hyperkalemia 08/15/2019  . GI bleed 08/01/2019  . Symptomatic anemia   . Anticoagulated   . Acute blood loss  anemia 07/30/2019  . Respiratory failure ([place])    PCP:  Townsend Roger, MD Pharmacy:  No Pharmacies Listed    Social Determinants of Health (SDOH) Interventions    Readmission Risk Interventions No flowsheet data found.

## 2019-08-20 DIAGNOSIS — I959 Hypotension, unspecified: Secondary | ICD-10-CM

## 2019-08-20 DIAGNOSIS — N179 Acute kidney failure, unspecified: Secondary | ICD-10-CM | POA: Diagnosis not present

## 2019-08-20 LAB — GLUCOSE, CAPILLARY
Glucose-Capillary: 108 mg/dL — ABNORMAL HIGH (ref 70–99)
Glucose-Capillary: 125 mg/dL — ABNORMAL HIGH (ref 70–99)
Glucose-Capillary: 91 mg/dL (ref 70–99)
Glucose-Capillary: 99 mg/dL (ref 70–99)

## 2019-08-20 LAB — NOVEL CORONAVIRUS, NAA (HOSP ORDER, SEND-OUT TO REF LAB; TAT 18-24 HRS): SARS-CoV-2, NAA: NOT DETECTED

## 2019-08-20 MED ORDER — WHITE PETROLATUM EX OINT
TOPICAL_OINTMENT | CUTANEOUS | Status: AC
  Filled 2019-08-20: qty 28.35

## 2019-08-20 NOTE — TOC Transition Note (Signed)
Transition of Care [facility]) - CM/SW Discharge Note   Patient Details  Name: Shirley Malone MRN: [id number] Date of Birth: 14-Apr-1941  Transition of Care [facility]) CM/SW Contact:  Bartholomew Crews, RN Phone Number: [phone] 08/20/2019, 12:45 PM   Clinical Narrative:    Received call back from [place] at [facility]. Will fax d/c summary and covid results to [phone]. Patient is able to return today. Will go to room 307. Dr. Nona Dell is the accepting provider. The nurse may call report to [phone]. [facility] notified for transport d/t patient being on trach collar at this time - scheduled transport for 1330. ICU RN, Lilia Pro, aware. Notified son, Truman Hayward, at [phone] to advise of return to [facility] this afternoon. No further transition of care needs identifed.    Final next level of care: Long Term Nursing Home Barriers to Discharge: No Barriers Identified   Patient Goals and CMS Choice        Discharge Placement              Patient chooses bed at: [facility] Patient to be transferred to facility by: [place] Name of family member notified: Daylani Loudenslager [phone] Patient and family notified of of transfer: 08/20/19  Discharge Plan and Services In-house Referral: NA Discharge Planning Services: NA            DME Arranged: N/A DME Agency: NA       HH Arranged: NA HH Agency: NA        Social Determinants of Health (SDOH) Interventions     Readmission Risk Interventions No flowsheet data found.

## 2019-08-20 NOTE — Progress Notes (Signed)
Patient seen and examined at the bedside this morning.  Currently hemodynamically stable.  Blood pressure has remained stable since yesterday.  She denies any complaints, looks comfortable.  There is no change in the plan from yesterday.  Discharge orders and summary has already been done.  She can be discharged to [facility] as soon as possible.  I had called her son and updated the plan yesterday.

## 2019-08-20 NOTE — Progress Notes (Signed)
Pt. discharged to [facility] via [facility] [place]. All belongings given to [facility] and transported with patient.

## 2019-08-20 NOTE — Progress Notes (Signed)
NAME:  Emmett Goldwasser, MRN:  [id number], DOB:  02-14-1941, LOS: 4 ADMISSION DATE:  08/15/2019, CONSULTATION DATE:  07/30/19 REFERRING MD:  Stark Jock  CHIEF COMPLAINT:  Vent Management   Brief History   Shirley Malone is a 78 y.o. female with chronic trach / PEG who resides at [facility], admitted 9/3 with hypotension. Reportedly BP 75/40 per [employer]. Hemoglobin 7.3. Transfusing one unit RBC. PCCM consulted for vent management.    History of present illness   Shirley Malone is a 78 y.o. female who has a PMH including but not limited to chronic respiratory failure s/p trach and who resides at [facility], tracheobronchomalacia, A.fib (on apixaban), dCHF, DVT RLE s/p anticoagulation and IVC filter, GI bleeding, anemia, CKD, obesity, hypothyroidism, DM2.  She was admitted to [facility] in May 2020 for RLL pseudomonas PNA and E.coli UTI (s/p 7 days merrem), RLE DVT s/p anticoagulation and IVC filter, recurrent GI bleed (Notes state she was placed on heparin and transitioned to low dose 2.5mg  apixaban BID as she has hx GI bleeding with higher doses of anticoagulation.  Baseline Hgb around 7 - 8.5).  She was discharged to [facility] on 05/31/19.    Recent Admission 8/18-8/20 with acute anemia. Given 2 units RBC, Gi Consulted. Suspected AVM. Held Eliquis. Transferred back to [facility].   Past Medical History  Chronic respiratory failure s/p trach and who resides at [facility], tracheobronchomalacia, A.fib (on apixaban), dCHF, DVT RLE s/p anticoagulation and IVC filter, GI bleeding, anemia, CKD, obesity, hypothyroidism, DM2  Significant Hospital Events   9/3 > admit.  Consults:  PCCM.  Procedures:  S/P trach and peg  Significant Diagnostic Tests:  None.  Micro Data:  SARS CoV2 9/3 > negative   Antimicrobials:  None.   Interim history/subjective:   Comfortable appearing, taking nap, on trach collar.   Objective:  Blood pressure 127/75, pulse 73, temperature 98.9 F (37.2 C), temperature  source Oral, resp. rate 14, height 5\' 1"  (1.549 m), weight 86.7 kg, SpO2 100 %.    Vent Mode: PRVC FiO2 (%):  [28 %-40 %] 40 % Set Rate:  [16 bmp] 16 bmp Vt Set:  [540 mL] 540 mL PEEP:  [5 cmH20] 5 cmH20 Plateau Pressure:  [20 cmH20] 20 cmH20   Intake/Output Summary (Last 24 hours) at 08/20/2019 0839 Last data filed at 08/20/2019 0600 Gross per 24 hour  Intake 1478.07 ml  Output 1875 ml  Net -396.93 ml   Filed Weights   08/18/19 0500 08/19/19 0600 08/20/19 0500  Weight: 88 kg 86.1 kg 86.7 kg    Examination: General:  Elderly female, supine in bed, NAD on trach collar  HEENT: NCAT, pink mmm, trach secure  Neuro: Awakens to voice, follows commands  CV: RRR s1s2 no rgm PULM:  Scattered rhonchi. Symmetrical chest expansion no accessory use on trach collar  GI: Soft, round, ndnt, PEG Extremities: No cyanosis. trace edema  Skin: Clean, dry, warm, without rash   Assessment & Plan:   Chronic respiratory failure, s/p tracheostomy  Plan  -Trach collar during day, vent support at night -Pulm hygiene -Routine trach care -PRN CXR -PRN Duonebs  Chronic Anemia with no overt signs of bleeding. -not on anticoagulation due to h/o bleeding  Plan -S/P 1 unit RBC -Trend CBC    PCCM will continue to see 1-2x/week for tracheostomy. Rest per primary   Best Practice:  Diet: NPO VAP protocol (if indicated): yes GI prophylaxis: PPI  Glucose control: trend  Mobility: BR Code Status: Full  Family Communication: Per  primary  Disposition: continue progressive care, discharge to [facility] when able   Labs   CBC: Recent Labs  Lab 08/15/19 1659 08/16/19 0658 08/17/19 0459  WBC 7.5 5.8 6.0  HGB 7.3* 8.1* 7.6*  HCT 23.5* 25.6* 23.9*  MCV 90.0 88.3 87.9  PLT 228 154 [id number]   Basic Metabolic Panel: Recent Labs  Lab 08/15/19 1659 08/16/19 0658 08/16/19 1539 08/17/19 0459 08/17/19 1958 08/18/19 0652 08/19/19 0413  NA 135 136  --  139  --  142 144  K 5.4* 4.1  --  4.1  --  4.2 4.2   CL 100 101  --  106  --  111 115*  CO2 23 23  --  21*  --  22 22  GLUCOSE 102* 86  --  104*  --  117* 106*  BUN 121* 114*  --  111*  --  102* 89*  CREATININE 4.36* 4.18*  --  3.91*  --  3.72* 3.45*  CALCIUM 9.1 9.3  --  8.8*  --  8.7* 8.8*  MG  --   --  2.4 2.3 2.2 2.2  --   PHOS  --   --  5.6* 5.4* 4.8* 4.2  --    GFR: Estimated Creatinine Clearance: 13.9 mL/min (A) (by C-G formula based on SCr of 3.45 mg/dL (H)). Recent Labs  Lab 08/15/19 1659 08/16/19 0658 08/17/19 0459  WBC 7.5 5.8 6.0   Liver Function Tests: Recent Labs  Lab 08/16/19 0658  AST 23  ALT 15  ALKPHOS 270*  BILITOT 0.8  PROT 7.0  ALBUMIN 2.4*   No results for input(s): LIPASE, AMYLASE in the last 168 hours. No results for input(s): AMMONIA in the last 168 hours. ABG    Component Value Date/Time   PHART 7.432 07/30/2019 2138   PCO2ART 44.1 07/30/2019 2138   PO2ART 125 (H) 07/30/2019 2138   HCO3 28.9 (H) 07/30/2019 2138   TCO2 23 07/04/2019 0103   ACIDBASEDEF 6.0 (H) 07/04/2019 0103   O2SAT 98.7 07/30/2019 2138    Coagulation Profile: No results for input(s): INR, PROTIME in the last 168 hours. Cardiac Enzymes: No results for input(s): CKTOTAL, CKMB, CKMBINDEX, TROPONINI in the last 168 hours. HbA1C: Hgb A1c MFr Bld  Date/Time Value Ref Range Status  07/28/2017 04:11 PM 5.8 (H) 4.8 - 5.6 % Final    Comment:    (NOTE) Pre diabetes:          5.7%-6.4% Diabetes:              >6.4% Glycemic control for   <7.0% adults with diabetes    CBG: Recent Labs  Lab 08/19/19 1624 08/19/19 1955 08/20/19 0009 08/20/19 0433 08/20/19 0736  GLUCAP 111* 109* 125* 91 99      Shirley Gum MSN, AGACNP-BC [place] [phone] If no answer, [phone] 08/20/2019, 8:39 AM

## 2019-08-20 NOTE — Progress Notes (Signed)
Called report to Suella Grove, RN at [facility].

## 2019-09-17 ENCOUNTER — Emergency Department (HOSPITAL_COMMUNITY): Payer: Medicare Other

## 2019-09-17 ENCOUNTER — Observation Stay (HOSPITAL_COMMUNITY)
Admission: EM | Admit: 2019-09-17 | Discharge: 2019-09-18 | Disposition: A | Discharge status: Skilled Nursing Facility | Payer: Medicare Other | Attending: Internal Medicine | Admitting: Internal Medicine

## 2019-09-17 ENCOUNTER — Encounter (HOSPITAL_COMMUNITY): Payer: Self-pay

## 2019-09-17 ENCOUNTER — Observation Stay (HOSPITAL_COMMUNITY): Payer: Medicare Other

## 2019-09-17 DIAGNOSIS — I82409 Acute embolism and thrombosis of unspecified deep veins of unspecified lower extremity: Secondary | ICD-10-CM | POA: Clinically undetermined

## 2019-09-17 DIAGNOSIS — N2581 Secondary hyperparathyroidism of renal origin: Secondary | ICD-10-CM | POA: Diagnosis not present

## 2019-09-17 DIAGNOSIS — G9341 Metabolic encephalopathy: Secondary | ICD-10-CM | POA: Diagnosis not present

## 2019-09-17 DIAGNOSIS — Z9911 Dependence on respirator [ventilator] status: Secondary | ICD-10-CM | POA: Insufficient documentation

## 2019-09-17 DIAGNOSIS — E1122 Type 2 diabetes mellitus with diabetic chronic kidney disease: Secondary | ICD-10-CM | POA: Diagnosis not present

## 2019-09-17 DIAGNOSIS — J9610 Chronic respiratory failure, unspecified whether with hypoxia or hypercapnia: Secondary | ICD-10-CM | POA: Diagnosis not present

## 2019-09-17 DIAGNOSIS — N19 Unspecified kidney failure: Secondary | ICD-10-CM | POA: Diagnosis present

## 2019-09-17 DIAGNOSIS — Z79890 Hormone replacement therapy (postmenopausal): Secondary | ICD-10-CM | POA: Insufficient documentation

## 2019-09-17 DIAGNOSIS — G4733 Obstructive sleep apnea (adult) (pediatric): Secondary | ICD-10-CM | POA: Diagnosis not present

## 2019-09-17 DIAGNOSIS — R251 Tremor, unspecified: Secondary | ICD-10-CM | POA: Diagnosis not present

## 2019-09-17 DIAGNOSIS — I482 Chronic atrial fibrillation, unspecified: Secondary | ICD-10-CM | POA: Diagnosis present

## 2019-09-17 DIAGNOSIS — Z888 Allergy status to other drugs, medicaments and biological substances status: Secondary | ICD-10-CM | POA: Insufficient documentation

## 2019-09-17 DIAGNOSIS — E875 Hyperkalemia: Secondary | ICD-10-CM

## 2019-09-17 DIAGNOSIS — R1310 Dysphagia, unspecified: Secondary | ICD-10-CM | POA: Diagnosis not present

## 2019-09-17 DIAGNOSIS — R569 Unspecified convulsions: Secondary | ICD-10-CM | POA: Diagnosis not present

## 2019-09-17 DIAGNOSIS — J9611 Chronic respiratory failure with hypoxia: Secondary | ICD-10-CM | POA: Diagnosis present

## 2019-09-17 DIAGNOSIS — Z91040 Latex allergy status: Secondary | ICD-10-CM | POA: Insufficient documentation

## 2019-09-17 DIAGNOSIS — E8889 Other specified metabolic disorders: Secondary | ICD-10-CM | POA: Insufficient documentation

## 2019-09-17 DIAGNOSIS — Z931 Gastrostomy status: Secondary | ICD-10-CM | POA: Insufficient documentation

## 2019-09-17 DIAGNOSIS — Z86718 Personal history of other venous thrombosis and embolism: Secondary | ICD-10-CM | POA: Insufficient documentation

## 2019-09-17 DIAGNOSIS — R4182 Altered mental status, unspecified: Secondary | ICD-10-CM | POA: Diagnosis not present

## 2019-09-17 DIAGNOSIS — I132 Hypertensive heart and chronic kidney disease with heart failure and with stage 5 chronic kidney disease, or end stage renal disease: Secondary | ICD-10-CM | POA: Insufficient documentation

## 2019-09-17 DIAGNOSIS — E039 Hypothyroidism, unspecified: Secondary | ICD-10-CM | POA: Diagnosis present

## 2019-09-17 DIAGNOSIS — Z930 Tracheostomy status: Secondary | ICD-10-CM | POA: Insufficient documentation

## 2019-09-17 DIAGNOSIS — Z885 Allergy status to narcotic agent status: Secondary | ICD-10-CM | POA: Insufficient documentation

## 2019-09-17 DIAGNOSIS — Z7901 Long term (current) use of anticoagulants: Secondary | ICD-10-CM | POA: Insufficient documentation

## 2019-09-17 DIAGNOSIS — Z6833 Body mass index (BMI) 33.0-33.9, adult: Secondary | ICD-10-CM | POA: Insufficient documentation

## 2019-09-17 DIAGNOSIS — J398 Other specified diseases of upper respiratory tract: Secondary | ICD-10-CM | POA: Diagnosis not present

## 2019-09-17 DIAGNOSIS — N185 Chronic kidney disease, stage 5: Secondary | ICD-10-CM | POA: Diagnosis present

## 2019-09-17 DIAGNOSIS — I5032 Chronic diastolic (congestive) heart failure: Secondary | ICD-10-CM | POA: Insufficient documentation

## 2019-09-17 DIAGNOSIS — E119 Type 2 diabetes mellitus without complications: Secondary | ICD-10-CM

## 2019-09-17 DIAGNOSIS — D631 Anemia in chronic kidney disease: Secondary | ICD-10-CM | POA: Diagnosis not present

## 2019-09-17 DIAGNOSIS — R066 Hiccough: Secondary | ICD-10-CM | POA: Diagnosis present

## 2019-09-17 DIAGNOSIS — Z79899 Other long term (current) drug therapy: Secondary | ICD-10-CM | POA: Insufficient documentation

## 2019-09-17 DIAGNOSIS — Z20828 Contact with and (suspected) exposure to other viral communicable diseases: Secondary | ICD-10-CM | POA: Insufficient documentation

## 2019-09-17 HISTORY — DX: Chronic atrial fibrillation, unspecified: I48.20

## 2019-09-17 LAB — POCT I-STAT 7, (LYTES, BLD GAS, ICA,H+H)
Acid-Base Excess: 5 mmol/L — ABNORMAL HIGH (ref 0.0–2.0)
Bicarbonate: 29 mmol/L — ABNORMAL HIGH (ref 20.0–28.0)
Calcium, Ion: 1.26 mmol/L (ref 1.15–1.40)
HCT: 28 % — ABNORMAL LOW (ref 36.0–46.0)
Hemoglobin: 9.5 g/dL — ABNORMAL LOW (ref 12.0–15.0)
O2 Saturation: 100 %
Patient temperature: 97.4
Potassium: 4.4 mmol/L (ref 3.5–5.1)
Sodium: 129 mmol/L — ABNORMAL LOW (ref 135–145)
TCO2: 30 mmol/L (ref 22–32)
pCO2 arterial: 36.5 mmHg (ref 32.0–48.0)
pH, Arterial: 7.507 — ABNORMAL HIGH (ref 7.350–7.450)
pO2, Arterial: 178 mmHg — ABNORMAL HIGH (ref 83.0–108.0)

## 2019-09-17 LAB — URINALYSIS, ROUTINE W REFLEX MICROSCOPIC
Bilirubin Urine: NEGATIVE
Glucose, UA: NEGATIVE mg/dL
Hgb urine dipstick: NEGATIVE
Ketones, ur: NEGATIVE mg/dL
Leukocytes,Ua: NEGATIVE
Nitrite: NEGATIVE
Protein, ur: NEGATIVE mg/dL
Specific Gravity, Urine: 1.011 (ref 1.005–1.030)
pH: 8 (ref 5.0–8.0)

## 2019-09-17 LAB — CBG MONITORING, ED: Glucose-Capillary: 83 mg/dL (ref 70–99)

## 2019-09-17 LAB — CBC WITH DIFFERENTIAL/PLATELET
Abs Immature Granulocytes: 0.09 10*3/uL — ABNORMAL HIGH (ref 0.00–0.07)
Basophils Absolute: 0 10*3/uL (ref 0.0–0.1)
Basophils Relative: 0 %
Eosinophils Absolute: 0.7 10*3/uL — ABNORMAL HIGH (ref 0.0–0.5)
Eosinophils Relative: 7 %
HCT: 29.6 % — ABNORMAL LOW (ref 36.0–46.0)
Hemoglobin: 9.9 g/dL — ABNORMAL LOW (ref 12.0–15.0)
Immature Granulocytes: 1 %
Lymphocytes Relative: 12 %
Lymphs Abs: 1.3 10*3/uL (ref 0.7–4.0)
MCH: 28.1 pg (ref 26.0–34.0)
MCHC: 33.4 g/dL (ref 30.0–36.0)
MCV: 84.1 fL (ref 80.0–100.0)
Monocytes Absolute: 0.9 10*3/uL (ref 0.1–1.0)
Monocytes Relative: 8 %
Neutro Abs: 7.9 10*3/uL — ABNORMAL HIGH (ref 1.7–7.7)
Neutrophils Relative %: 72 %
Platelets: 219 10*3/uL (ref 150–400)
RBC: 3.52 MIL/uL — ABNORMAL LOW (ref 3.87–5.11)
RDW: 15.6 % — ABNORMAL HIGH (ref 11.5–15.5)
WBC: 10.8 10*3/uL — ABNORMAL HIGH (ref 4.0–10.5)
nRBC: 0 % (ref 0.0–0.2)

## 2019-09-17 LAB — COMPREHENSIVE METABOLIC PANEL
ALT: 29 U/L (ref 0–44)
AST: 47 U/L — ABNORMAL HIGH (ref 15–41)
Albumin: 2.7 g/dL — ABNORMAL LOW (ref 3.5–5.0)
Alkaline Phosphatase: 408 U/L — ABNORMAL HIGH (ref 38–126)
Anion gap: 15 (ref 5–15)
BUN: 117 mg/dL — ABNORMAL HIGH (ref 8–23)
CO2: 27 mmol/L (ref 22–32)
Calcium: 10 mg/dL (ref 8.9–10.3)
Chloride: 88 mmol/L — ABNORMAL LOW (ref 98–111)
Creatinine, Ser: 3.65 mg/dL — ABNORMAL HIGH (ref 0.44–1.00)
GFR calc Af Amer: 13 mL/min — ABNORMAL LOW (ref >60)
GFR calc non Af Amer: 11 mL/min — ABNORMAL LOW (ref >60)
Glucose, Bld: 104 mg/dL — ABNORMAL HIGH (ref 70–99)
Potassium: 5.4 mmol/L — ABNORMAL HIGH (ref 3.5–5.1)
Sodium: 130 mmol/L — ABNORMAL LOW (ref 135–145)
Total Bilirubin: 0.8 mg/dL (ref 0.3–1.2)
Total Protein: 7.8 g/dL (ref 6.5–8.1)

## 2019-09-17 LAB — MRSA PCR SCREENING: MRSA by PCR: POSITIVE — AB

## 2019-09-17 LAB — GLUCOSE, CAPILLARY
Glucose-Capillary: 71 mg/dL (ref 70–99)
Glucose-Capillary: 90 mg/dL (ref 70–99)
Glucose-Capillary: 95 mg/dL (ref 70–99)

## 2019-09-17 LAB — SARS CORONAVIRUS 2 (TAT 6-24 HRS): SARS Coronavirus 2: NEGATIVE

## 2019-09-17 MED ORDER — CHLORHEXIDINE GLUCONATE 0.12% ORAL RINSE (MEDLINE KIT)
15.0000 mL | Freq: Two times a day (BID) | OROMUCOSAL | Status: DC
  Administered 2019-09-17 – 2019-09-18 (×2): 15 mL via OROMUCOSAL

## 2019-09-17 MED ORDER — ACETAMINOPHEN 650 MG RE SUPP: 650.0000 mg | Freq: Four times a day (QID) | RECTAL | Status: DC | PRN

## 2019-09-17 MED ORDER — ORAL CARE MOUTH RINSE
15.0000 mL | Freq: Four times a day (QID) | OROMUCOSAL | Status: DC
  Administered 2019-09-17 – 2019-09-18 (×2): 15 mL via OROMUCOSAL

## 2019-09-17 MED ORDER — CHLORPROMAZINE HCL 25 MG PO TABS
25.0000 mg | ORAL_TABLET | Freq: Once | ORAL | Status: AC
  Administered 2019-09-17: 06:00:00 25 mg via ORAL
  Filled 2019-09-17: qty 1

## 2019-09-17 MED ORDER — INSULIN ASPART 100 UNIT/ML ~~LOC~~ SOLN
10.0000 [IU] | Freq: Once | SUBCUTANEOUS | Status: AC
  Administered 2019-09-17: 10 [IU] via INTRAVENOUS

## 2019-09-17 MED ORDER — DEXTROSE-NACL 5-0.9 % IV SOLN
INTRAVENOUS | Status: DC
  Administered 2019-09-17 – 2019-09-18 (×2): via INTRAVENOUS

## 2019-09-17 MED ORDER — DEXTROSE 50 % IV SOLN
1.0000 | Freq: Once | INTRAVENOUS | Status: AC
  Administered 2019-09-17: 06:00:00 50 mL via INTRAVENOUS
  Filled 2019-09-17: qty 50

## 2019-09-17 MED ORDER — ONDANSETRON HCL 4 MG/2ML IJ SOLN: 4.0000 mg | Freq: Four times a day (QID) | INTRAMUSCULAR | Status: DC | PRN

## 2019-09-17 MED ORDER — CALCIUM CARBONATE ANTACID 1250 MG/5ML PO SUSP
500.0000 mg | Freq: Four times a day (QID) | ORAL | Status: DC | PRN
  Filled 2019-09-17: qty 5

## 2019-09-17 MED ORDER — ONDANSETRON HCL 4 MG/2ML IJ SOLN
4.0000 mg | Freq: Once | INTRAMUSCULAR | Status: AC
  Administered 2019-09-17: 4 mg via INTRAVENOUS
  Filled 2019-09-17: qty 2

## 2019-09-17 MED ORDER — LORATADINE 10 MG PO TABS
10.0000 mg | ORAL_TABLET | Freq: Every day | ORAL | Status: DC
  Administered 2019-09-17 – 2019-09-18 (×2): 10 mg via ORAL
  Filled 2019-09-17 (×2): qty 1

## 2019-09-17 MED ORDER — ONDANSETRON HCL 4 MG PO TABS: 4.0000 mg | ORAL_TABLET | Freq: Four times a day (QID) | ORAL | Status: DC | PRN

## 2019-09-17 MED ORDER — PANTOPRAZOLE SODIUM 40 MG PO TBEC
40.0000 mg | DELAYED_RELEASE_TABLET | Freq: Two times a day (BID) | ORAL | Status: DC
  Administered 2019-09-17 – 2019-09-18 (×3): 40 mg via ORAL
  Filled 2019-09-17 (×3): qty 1

## 2019-09-17 MED ORDER — SODIUM CHLORIDE 0.9 % IV BOLUS
1000.0000 mL | Freq: Once | INTRAVENOUS | Status: AC
  Administered 2019-09-17: 06:00:00 1000 mL via INTRAVENOUS

## 2019-09-17 MED ORDER — CHLORHEXIDINE GLUCONATE 0.12 % MT SOLN
15.0000 mL | Freq: Two times a day (BID) | OROMUCOSAL | Status: DC
  Administered 2019-09-17: 15 mL via OROMUCOSAL
  Filled 2019-09-17 (×2): qty 15

## 2019-09-17 MED ORDER — CHLORHEXIDINE GLUCONATE 0.12% ORAL RINSE (MEDLINE KIT): 15.0000 mL | Freq: Two times a day (BID) | OROMUCOSAL | Status: DC

## 2019-09-17 MED ORDER — ACETAMINOPHEN 325 MG PO TABS: 650.0000 mg | ORAL_TABLET | Freq: Four times a day (QID) | ORAL | Status: DC | PRN

## 2019-09-17 MED ORDER — LORAZEPAM 2 MG/ML IJ SOLN: 1.0000 mg | INTRAMUSCULAR | Status: DC | PRN

## 2019-09-17 MED ORDER — SORBITOL 70 % SOLN
30.0000 mL | Status: DC | PRN
  Filled 2019-09-17: qty 30

## 2019-09-17 MED ORDER — ZOLPIDEM TARTRATE 5 MG PO TABS: 5.0000 mg | ORAL_TABLET | Freq: Every evening | ORAL | Status: DC | PRN

## 2019-09-17 MED ORDER — LORAZEPAM 2 MG/ML IJ SOLN
0.5000 mg | Freq: Once | INTRAMUSCULAR | Status: AC
  Administered 2019-09-17: 0.5 mg via INTRAVENOUS
  Filled 2019-09-17: qty 1

## 2019-09-17 MED ORDER — DOCUSATE SODIUM 283 MG RE ENEM
1.0000 | ENEMA | RECTAL | Status: DC | PRN
  Filled 2019-09-17: qty 1

## 2019-09-17 MED ORDER — SERTRALINE HCL 100 MG PO TABS
100.0000 mg | ORAL_TABLET | Freq: Every day | ORAL | Status: DC
  Administered 2019-09-17 – 2019-09-18 (×2): 100 mg via ORAL
  Filled 2019-09-17 (×2): qty 1

## 2019-09-17 MED ORDER — CAMPHOR-MENTHOL 0.5-0.5 % EX LOTN
1.0000 "application " | TOPICAL_LOTION | Freq: Three times a day (TID) | CUTANEOUS | Status: DC | PRN
  Administered 2019-09-17 – 2019-09-18 (×3): 1 via TOPICAL
  Filled 2019-09-17 (×2): qty 222

## 2019-09-17 MED ORDER — NEPRO/CARBSTEADY PO LIQD
237.0000 mL | Freq: Three times a day (TID) | ORAL | Status: DC | PRN
  Filled 2019-09-17: qty 237

## 2019-09-17 MED ORDER — HYDROXYZINE HCL 25 MG PO TABS
25.0000 mg | ORAL_TABLET | Freq: Three times a day (TID) | ORAL | Status: DC | PRN
  Administered 2019-09-17 – 2019-09-18 (×2): 25 mg via ORAL
  Filled 2019-09-17 (×3): qty 1

## 2019-09-17 MED ORDER — CARVEDILOL 6.25 MG PO TABS: 6.2500 mg | ORAL_TABLET | Freq: Two times a day (BID) | ORAL | Status: DC

## 2019-09-17 MED ORDER — CHLORPROMAZINE HCL 25 MG PO TABS
25.0000 mg | ORAL_TABLET | Freq: Four times a day (QID) | ORAL | Status: DC | PRN
  Administered 2019-09-17: 25 mg via ORAL
  Filled 2019-09-17 (×2): qty 1

## 2019-09-17 MED ORDER — HEPARIN SODIUM (PORCINE) 5000 UNIT/ML IJ SOLN
5000.0000 [IU] | Freq: Three times a day (TID) | INTRAMUSCULAR | Status: DC
  Administered 2019-09-17 – 2019-09-18 (×2): 5000 [IU] via SUBCUTANEOUS
  Filled 2019-09-17 (×2): qty 1

## 2019-09-17 MED ORDER — CHLORHEXIDINE GLUCONATE CLOTH 2 % EX PADS
6.0000 | MEDICATED_PAD | Freq: Every day | CUTANEOUS | Status: DC
  Administered 2019-09-17: 6 via TOPICAL

## 2019-09-17 MED ORDER — SODIUM CHLORIDE 0.9 % IV SOLN
75.0000 mL/h | INTRAVENOUS | Status: DC
  Administered 2019-09-17: 75 mL/h via INTRAVENOUS

## 2019-09-17 MED ORDER — LEVOTHYROXINE SODIUM 100 MCG PO TABS
100.0000 ug | ORAL_TABLET | Freq: Every day | ORAL | Status: DC
  Administered 2019-09-18: 100 ug via ORAL
  Filled 2019-09-17: qty 1

## 2019-09-17 MED ORDER — ORAL CARE MOUTH RINSE: 15.0000 mL | OROMUCOSAL | Status: DC

## 2019-09-17 MED ORDER — CHLORPROMAZINE HCL 25 MG PO TABS
25.0000 mg | ORAL_TABLET | Freq: Once | ORAL | Status: AC
  Administered 2019-09-17: 25 mg via ORAL
  Filled 2019-09-17: qty 1

## 2019-09-17 NOTE — ED Provider Notes (Signed)
Signout from Dr. Dina Rich.  Patient transferred from [facility] trach and vent for altered mental status and possible seizure twitching activity.  Found to be uremic.  Patient is still pending a head CT and is not back to baseline.  Patient will need to be admission to the hospital ICU versus hospitalist with ICU consult. Physical Exam  BP 108/73   Pulse 61   Temp (!) 97.4 F (36.3 C) (Temporal)   Resp 17   Ht 5\' 1"  (1.549 m)   SpO2 100%   BMI 36.12 kg/m   Physical Exam  ED Course/Procedures     Procedures  MDM  Patient's head CT does not show any acute findings.  I reached out to [facility] facility Dr. Nona Dell who said that she is from the [facility] home facility and they would not be able to care for and work-up this problem on site.  He said it was reported to him that she was having a seizure episode this morning which she has no prior history of.  Discussed with Dr. Lorin Mercy from the hospitalist service who will evaluate the patient in the emergency department for possible admission.       Hayden Rasmussen, MD 09/17/19 1143

## 2019-09-17 NOTE — Progress Notes (Signed)
Patient transported from ED to [id number] without complications. RN at bedside

## 2019-09-17 NOTE — ED Notes (Signed)
Changed pt's adult diaper and repositioned pt.

## 2019-09-17 NOTE — ED Provider Notes (Signed)
[facility] EMERGENCY DEPARTMENT Provider Note   CSN: [id number] Arrival date & time: 09/17/19  0435     History   Chief Complaint Chief Complaint  Patient presents with  . Altered Mental Status    HPI Shirley Malone is a 78 y.o. female.     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  History obtained from critical care transport unit.  Patient is cared for at [facility].  Per report, noted to have an episode of shaking and possible seizure-like activity.  No indication of postictal state.  There was initially report that she had coded; however, the transport team states that this was not true and that a CODE BLUE was called but the patient had a second episode of shaking.  Upon their arrival, she was noted to be at her mental status baseline.  No reported recent illnesses.  Patient does not provide any history as she is vent dependent and unable to speak.  Unclear baseline.  Level 5 caveat.  Past Medical History:  Diagnosis Date  . Anemia   . Atrial fibrillation ([place])   . Chronic kidney disease   . Chronic respiratory failure ([place])   . Diabetes mellitus without complication ([place])   . DVT (deep venous thrombosis) ([place])   . Gastrointestinal bleed   . Morbid obesity ([place])   . 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 Thyroid disease     Patient Active Problem List   Diagnosis Date Noted  . Pressure injury of skin 08/17/2019  . AKI (acute kidney injury) ([place]) 08/16/2019  . Hypotension 08/15/2019  . ARF (acute renal failure) ([place]) 08/15/2019  . Hyperkalemia 08/15/2019  . GI bleed 08/01/2019  . Symptomatic anemia   . Anticoagulated   . Acute blood loss anemia 07/30/2019  . Respiratory failure [facility])     Past Surgical History:  Procedure Laterality Date  . PEG  TUBE PLACEMENT    . TRACHEOSTOMY       OB History   No obstetric history on file.      Home Medications    Prior to Admission medications   Medication Sig Start Date End Date Taking? Authorizing Provider  ACIDOPHILUS LACTOBACILLUS PO Take 1 tablet by mouth 2 (two) times daily.    [provider]  Amino Acids-Protein Hydrolys (FEEDING SUPPLEMENT, PRO-STAT SUGAR FREE 64,) LIQD Take 30 mLs by mouth every 12 (twelve) hours.    [provider]  carvedilol (COREG) 6.25 MG tablet 1 tablet (6.25 mg total) by Per J Tube route 2 (two) times daily with a meal. 08/19/19   Shelly Coss, MD  cetirizine (ZYRTEC) 10 MG tablet Take 10 mg by mouth daily.    [provider]  chlorhexidine (PERIDEX) 0.12 % solution Use as directed 15 mLs in the mouth or throat 2 (two) times daily.    [provider]  Cholecalciferol (VITAMIN D3) 50 MCG (2000 UT) TABS Take 1 tablet by mouth daily.    [provider]  Darbepoetin Alfa (ARANESP) 25 MCG/0.42ML SOSY injection Inject 25 mcg into the skin every 7 (seven) days.    [provider]  diltiazem (CARDIZEM) 30 MG tablet Take 30 mg by mouth 2 (two) times daily.    [provider]  ferrous sulfate 325 (65 FE) MG tablet Take 325 mg by mouth  daily with breakfast.    [provider]  folic acid (FOLVITE) 1 MG tablet Take 1 mg by mouth daily.    [provider]  hydrOXYzine (ATARAX/VISTARIL) 25 MG tablet Take 25 mg by mouth 3 (three) times daily as needed for itching.    [provider]  levothyroxine (SYNTHROID) 100 MCG tablet Take 100 mcg by mouth daily before breakfast.    [provider]  loperamide (IMODIUM A-D) 2 MG tablet Take 2 mg by mouth daily.    [provider]  Melatonin 3 MG TABS Take 1 tablet by mouth at bedtime.    [provider]  Multiple Vitamin (MULTIVITAMIN WITH MINERALS) TABS tablet Take 1 tablet by mouth daily.    [provider]   pantoprazole (PROTONIX) 40 MG tablet Take 1 tablet (40 mg total) by mouth 2 (two) times daily. Patient taking differently: Take 40 mg by mouth daily.  08/01/19   Shelly Coss, MD  phenol (CHLORASEPTIC) 1.4 % LIQD Use as directed 1 spray in the mouth or throat every 12 (twelve) hours.    [provider]  sertraline (ZOLOFT) 100 MG tablet Take 100 mg by mouth daily.    [provider]    Family History No family history on file.  Social History Social History   Tobacco Use  . Smoking status: Never Smoker  . Smokeless tobacco: Never Used  Substance Use Topics  . Alcohol use: Not Currently  . Drug use: Not Currently     Allergies   Clarithromycin, Codeine, Latex, and Other   Review of Systems Review of Systems  Unable to perform ROS: Other (On ventilator)     Physical Exam Updated Vital Signs BP 108/73   Pulse 61   Temp (!) 97.4 F (36.3 C) (Temporal)   Resp 17   Ht 1.549 m (5\' 1" )   SpO2 100%   BMI 36.12 kg/m   Physical Exam Vitals signs and nursing note reviewed.  Constitutional:      Appearance: She is well-developed. She is obese.     Comments: Chronically ill-appearing but nontoxic, hiccuping  HENT:     Head: Normocephalic and atraumatic.     Nose: No congestion.     Mouth/Throat:     Mouth: Mucous membranes are moist.  Eyes:     Pupils: Pupils are equal, round, and reactive to light.  Neck:     Musculoskeletal: Neck supple.  Cardiovascular:     Rate and Rhythm: Normal rate and regular rhythm.     Pulses: Normal pulses.     Heart sounds: Normal heart sounds.  Pulmonary:     Effort: Pulmonary effort is normal. No respiratory distress.     Breath sounds: No wheezing.     Comments: Trach and vent dependent, no distress noted Mostly clear breath sounds with an occasional wheeze Abdominal:     General: Bowel sounds are normal.     Palpations: Abdomen is soft.     Tenderness: There is no abdominal tenderness.     Comments: G-tube   Musculoskeletal:     Comments: Trace bilateral lower extremity edema  Skin:    General: Skin is warm and dry.  Neurological:     Mental Status: She is alert.     Comments: Unable to assess orientation, patient moves all 4 extremities but does not appear to follow commands  Psychiatric:     Comments: Unable to assess      ED Treatments / Results  Labs (  all labs ordered are listed, but only abnormal results are displayed) Labs Reviewed  CBC WITH DIFFERENTIAL/PLATELET - Abnormal; Notable for the following components:      Result Value   WBC 10.8 (*)    RBC 3.52 (*)    Hemoglobin 9.9 (*)    HCT 29.6 (*)    RDW 15.6 (*)    Neutro Abs 7.9 (*)    Eosinophils Absolute 0.7 (*)    Abs Immature Granulocytes 0.09 (*)    All other components within normal limits  COMPREHENSIVE METABOLIC PANEL - Abnormal; Notable for the following components:   Sodium 130 (*)    Potassium 5.4 (*)    Chloride 88 (*)    Glucose, Bld 104 (*)    BUN 117 (*)    Creatinine, Ser 3.65 (*)    Albumin 2.7 (*)    AST 47 (*)    Alkaline Phosphatase 408 (*)    GFR calc non Af Amer 11 (*)    GFR calc Af Amer 13 (*)    All other components within normal limits  POCT I-STAT 7, (LYTES, BLD GAS, ICA,H+H) - Abnormal; Notable for the following components:   pH, Arterial 7.507 (*)    pO2, Arterial 178.0 (*)    Bicarbonate 29.0 (*)    Acid-Base Excess 5.0 (*)    Sodium 129 (*)    HCT 28.0 (*)    Hemoglobin 9.5 (*)    All other components within normal limits  SARS CORONAVIRUS 2 (TAT 6-24 HRS)  URINALYSIS, ROUTINE W REFLEX MICROSCOPIC  BLOOD GAS, ARTERIAL    EKG EKG Interpretation  Date/Time:  Tuesday September 17 2019 05:00:20 EDT Ventricular Rate:  59 PR Interval:    QRS Duration: 100 QT Interval:  452 QTC Calculation: 448 R Axis:     Text Interpretation:  Sinus rhythm Borderline low voltage, extremity leads No significant change since last tracing Confirmed by Thayer Jew [id number]) on 09/17/2019  5:18:52 AM   Radiology Dg Chest Portable 1 View  Result Date: 09/17/2019 CLINICAL DATA:  Altered mental status EXAM: PORTABLE CHEST 1 VIEW COMPARISON:  08/15/2019 FINDINGS: Tracheostomy tube remains well seated; over distended cuff no longer visualized. Cardiomegaly and aortic tortuosity accentuated by rightward rotation. Stable aeration. There is no edema, consolidation, effusion, or pneumothorax. IMPRESSION: No evidence of acute disease. Cardiomegaly. Electronically Signed   By: Monte Fantasia M.D.   On: 09/17/2019 05:30    Procedures Procedures (including critical care time)  CRITICAL CARE Performed by: Merryl Hacker   Total critical care time: 40 minutes  Critical care time was exclusive of separately billable procedures and treating other patients.  Critical care was necessary to treat or prevent imminent or life-threatening deterioration.  Critical care was time spent personally by me on the following activities: development of treatment plan with patient and/or surrogate as well as nursing, discussions with consultants, evaluation of patient's response to treatment, examination of patient, obtaining history from patient or surrogate, ordering and performing treatments and interventions, ordering and review of laboratory studies, ordering and review of radiographic studies, pulse oximetry and re-evaluation of patient's condition.   Medications Ordered in ED Medications  chlorproMAZINE (THORAZINE) tablet 25 mg (25 mg Oral Given 09/17/19 0611)  sodium chloride 0.9 % bolus 1,000 mL (1,000 mLs Intravenous New Bag/Given 09/17/19 0604)  insulin aspart (novoLOG) injection 10 Units (10 Units Intravenous Given 09/17/19 0610)  dextrose 50 % solution 50 mL (50 mLs Intravenous Given 09/17/19 0606)  LORazepam (ATIVAN) injection 0.5 mg (  0.5 mg Intravenous Given 09/17/19 0737)     Initial Impression / Assessment and Plan / ED Course  I have reviewed the triage vital signs and the nursing  notes.  Pertinent labs & imaging results that were available during my care of the patient were reviewed by me and considered in my medical decision making (see chart for details).        Patient presents with altered mental status and seizure-like activity.  She is chronically ill-appearing but nontoxic.  She appears to be hiccuping frequently.  Based on chart review, at baseline it appears that she can answer questions and follow commands.  Vital signs are largely reassuring.  She is not hypotensive or febrile.  Breath sounds are fairly clear.  Work-up initiated including basic labs, urinalysis, EKG, and CT head.  Lab work notable for significant uremia to 117.  Creatinine close to baseline.  She also is hyperkalemic to 5.4.  No EKG changes.  Patient was given 1 L fluids, insulin, and glucose.  Chest x-ray shows no evidence of pneumothorax or pneumonia.  Patient with difficulty obtaining CT scan.  She was given Thorazine for her hiccups and a small dose of Ativan.  Will reattempt CT.  Patient will need admission following CT for possible seizure-like episode versus altered mental status.  Suspect uremia is contributing.  Could the testing is pending.  No indication of acute infectious process at this time.  Signed out to oncoming provider.  Final Clinical Impressions(s) / ED Diagnoses   Final diagnoses:  Altered mental status, unspecified altered mental status type  Uremia  Hyperkalemia  Seizure-like activity [facility])    ED Discharge Orders    None       Dina Rich, Barbette Hair, MD 09/17/19 [phone]

## 2019-09-17 NOTE — ED Notes (Signed)
Discontinued pt's nonviolent restraints. As pt is resting. Placed mitts on pt's hands in the event she wakes up and tries to pull out any lines. Per report pt was not cooperative last pm, and trying to pull out IV.

## 2019-09-17 NOTE — Consult Note (Signed)
NAME:  Shirley Malone, MRN:  [id number], DOB:  1941-10-06, LOS: 0 ADMISSION DATE:  09/17/2019, CONSULTATION DATE:  09/17/2019 REFERRING MD: Dr. Lorin Mercy , CHIEF COMPLAINT:  AMS   Brief History   (gathered from chart review)  04/2019 admitted for RLL pseudomonas PNA and E.coli UTI (s/p 7 days merrem), RLE DVT s/p anticoagulation and IVC filter, recurrent GI bleed (Notes state she was placed on heparin and transitioned to low dose 2.5mg  apixaban BID as she has hx GI bleeding with higher doses of anticoagulation. Baseline Hgb around 7 - 8.5) 05/31/19 discharged to [facility].   8/18-8/20 admitted with acute anemia. Given 2 units RBC, Gi Consulted. Suspected AVM. Held Eliquis. Transferred back to [facility].   9/3-8 admitted for hypotension, possibly related to dehydration.  She was thought to have symptomatic anemia with Hgb 7-8 and heme + stool with h/o extensive GI evaluation.  CKD with baseline creatinine 3.3.   Has had the trach for about 6 years - diagnosed with severe OSA.  PEG placed about a month ago because [facility] reported aspiration.  They were in the process of trying to get her eating again, started with ice chips and has not progressed past that and due for another assessment this week.   History of present illness   (gathered from chart review and nurse report) Long-term vent/trach (about 6 years)-patient was last seen at her normal baseline a few days ago.  Son noticed that on face time patient had progressive worsening of mental status and alertness, at baseline she is usually able to follow commands.  At SNF, episode this AM with shaking episodes.  Past Medical History  (gathered from chart review) chronic respiratory failure s/p trach and who resides at [facility], tracheobronchomalacia, A.fib (on apixaban), dCHF, DVT RLE s/p anticoagulation and IVC filter, GI bleeding, anemia, CKD, obesity, hypothyroidism, DM2  Significant Hospital Events   10/6 admitted  Consults:  Nephrology  Neurology  CCM  Procedures:  EEG  Significant Diagnostic Tests:  Head CT EEG- read pending Renal US- mild increased echogenicity within R kidney, no obstruction  Micro Data:  10/6- urinalysis neg COVID neg  Antimicrobials:   Interim history/subjective:  Patient makes intermittent incomprehensible sounds but does not respond to commands.  Does not appear to be in apparent distress but her noises could indicate that she has pain.  Objective   Blood pressure 120/69, pulse 61, temperature (!) 97.4 F (36.3 C), temperature source Temporal, resp. rate 15, height 5\' 1"  (1.549 m), SpO2 100 %.    Vent Mode: PRVC FiO2 (%):  [40 %-100 %] 40 % Set Rate:  [18 bmp] 18 bmp Vt Set:  [380 mL] 380 mL PEEP:  [5 cmH20] 5 cmH20 Plateau Pressure:  [13 cmH20-16 cmH20] 13 cmH20   Intake/Output Summary (Last 24 hours) at 09/17/2019 1456 Last data filed at 09/17/2019 0807 Gross per 24 hour  Intake 1000 ml  Output -  Net 1000 ml   There were no vitals filed for this visit.  Examination: General: Resting comfortably HENT: Dry mucous membranes Lungs: Clear breath sounds throughout Cardiovascular: Distant heart sounds, regular rhythm without murmur, good capillary refill in lower extremities Abdomen: Soft, nontender to palpation, normal bowel sounds throughout Extremities: Trace edema lower extremities Neuro: Moving left upper extremity and not right.  Patient is semi-alert and open eyes to voice.  Makes intermittent and incomprehensible noises but cannot answer questions or follow commands.  Resolved Hospital Problem list     Assessment & Plan:  78yo female  with multiple comorbidities including long-term vent/trach support presents with decreased arousability and witnessed seizure like activity since yesterday.  AMS w/histoy of seizure like activity- with no obvious signs of infection, or seizure like activity on presentation.  Head CT negative for acute abnormality.  Likely 2/2 metabolic  encephalopathy.   EEG showed continuous slow, generalized maximal bitemporal and triphasic waves, generalized, maximal bifrontal as interpreted by neurologist. -Follow-up neurology -Seizure precautions -Frequent neuro checks  H/o CKD IV with acute worsening and electrolyte abnormalities- this is likely main contributing problem. hyponatremia 130, kyperkalemia 5.4 ECG sinus rhythm, regular rate, negative for peaked T waves. Per nephrology, not good candidate for HD.  Patient continues to be intermittently bradycardic while on the floor. - correction per primary/nephrology -Continuous cardiac monitoring   Trach/vent dependent chronic respiratory failure- patient currently on vent with 40% FiO2 and satting 99%.  Baseline support unknown however, She may have been on the vent all the time recently due to secretions, but she usually only uses the vent at night and is on trach collar during the day. Metabolic alkalosis present.  Chest x-ray negative for acute disease. -Continuous vent support -Review history for cause of trach and vent dependence  Chronic anemia- hgb 9.9 today. -CBC a.m.  Hypoglycemia-Leukos in the range of 71-1 04 since admission and patient remains n.p.o. with altered mental status.  Modifying fluids from normal saline to include D5. -Continue to monitor CBGs per floor protocol -Continued management per primary  Best practice:  Diet: NPO while altered Pain/Anxiety/Delirium protocol (if indicated): tylenol, zoloft, ambien, atarax, ativan PRN VAP protocol (if indicated): in place DVT prophylaxis: on full dose anticoagulation GI prophylaxis: protonix, sorbitol Glucose control: SSI Mobility: bed rest Code Status: full- per son Family Communication: with primary Disposition: ICU for vent support  Labs   CBC: Recent Labs  Lab 09/17/19 0455 09/17/19 0653  WBC 10.8*  --   NEUTROABS 7.9*  --   HGB 9.9* 9.5*  HCT 29.6* 28.0*  MCV 84.1  --   PLT 219  --     Basic  Metabolic Panel: Recent Labs  Lab 09/17/19 0455 09/17/19 0653  NA 130* 129*  K 5.4* 4.4  CL 88*  --   CO2 27  --   GLUCOSE 104*  --   BUN 117*  --   CREATININE 3.65*  --   CALCIUM 10.0  --    GFR: CrCl cannot be calculated (Unknown ideal weight.). Recent Labs  Lab 09/17/19 0455  WBC 10.8*    Liver Function Tests: Recent Labs  Lab 09/17/19 0455  AST 47*  ALT 29  ALKPHOS 408*  BILITOT 0.8  PROT 7.8  ALBUMIN 2.7*   ABG    Component Value Date/Time   PHART 7.507 (H) 09/17/2019 0653   PCO2ART 36.5 09/17/2019 0653   PO2ART 178.0 (H) 09/17/2019 0653   HCO3 29.0 (H) 09/17/2019 0653   TCO2 30 09/17/2019 0653   ACIDBASEDEF 6.0 (H) 07/04/2019 0103   O2SAT 100.0 09/17/2019 0653    CBG: Recent Labs  Lab 09/17/19 1025  GLUCAP 83    Review of Systems:   Unable to obtain  Past Medical History  She,  has a past medical history of Anemia, Atrial fibrillation ([place]), Atrial fibrillation, chronic ([place]) (09/17/2019), Chronic kidney disease, Chronic respiratory failure ([place]), Diabetes mellitus without complication ([place]), DVT (deep venous thrombosis) ([place]), Gastrointestinal bleed, Morbid obesity ([place]), Obesity hypoventilation syndrome ([place]), and Thyroid disease.   Surgical History    Past Surgical History:  Procedure Laterality Date  . PEG TUBE PLACEMENT    . TRACHEOSTOMY       Social History   reports that she has never smoked. She has never used smokeless tobacco. She reports previous alcohol use. She reports previous drug use.   Allergies Allergies  Allergen Reactions  . Clarithromycin Itching and Rash  . Codeine Itching    Other reaction(s): Unknown  . Latex Hives    Other reaction(s): Other Blisters   . Other     adhesive on tape     Home Medications  Prior to Admission medications   Medication Sig Start Date End Date Taking? Authorizing Provider  ACIDOPHILUS LACTOBACILLUS PO Take 1 tablet by mouth 2 (two) times daily.    [provider]   Amino Acids-Protein Hydrolys (FEEDING SUPPLEMENT, PRO-STAT SUGAR FREE 64,) LIQD Take 30 mLs by mouth every 12 (twelve) hours.    [provider]  carvedilol (COREG) 6.25 MG tablet 1 tablet (6.25 mg total) by Per J Tube route 2 (two) times daily with a meal. 08/19/19   Shelly Coss, MD  cetirizine (ZYRTEC) 10 MG tablet Take 10 mg by mouth daily.    [provider]  chlorhexidine (PERIDEX) 0.12 % solution Use as directed 15 mLs in the mouth or throat 2 (two) times daily.    [provider]  Cholecalciferol (VITAMIN D3) 50 MCG (2000 UT) TABS Take 1 tablet by mouth daily.    [provider]  Darbepoetin Alfa (ARANESP) 25 MCG/0.42ML SOSY injection Inject 25 mcg into the skin every 7 (seven) days.    [provider]  diltiazem (CARDIZEM) 30 MG tablet Take 30 mg by mouth 2 (two) times daily.    [provider]  ferrous sulfate 325 (65 FE) MG tablet Take 325 mg by mouth daily with breakfast.    [provider]  folic acid (FOLVITE) 1 MG tablet Take 1 mg by mouth daily.    [provider]  hydrOXYzine (ATARAX/VISTARIL) 25 MG tablet Take 25 mg by mouth 3 (three) times daily as needed for itching.    [provider]  levothyroxine (SYNTHROID) 100 MCG tablet Take 100 mcg by mouth daily before breakfast.    [provider]  loperamide (IMODIUM A-D) 2 MG tablet Take 2 mg by mouth daily.    [provider]  Melatonin 3 MG TABS Take 1 tablet by mouth at bedtime.    [provider]  Multiple Vitamin (MULTIVITAMIN WITH MINERALS) TABS tablet Take 1 tablet by mouth daily.    [provider]  pantoprazole (PROTONIX) 40 MG tablet Take 1 tablet (40 mg total) by mouth 2 (two) times daily. Patient taking differently: Take 40 mg by mouth daily.  08/01/19   Shelly Coss, MD  phenol (CHLORASEPTIC) 1.4 % LIQD Use as directed 1 spray in the mouth or throat every 12 (twelve) hours.    [provider]   sertraline (ZOLOFT) 100 MG tablet Take 100 mg by mouth daily.    [provider]     Critical care time: **    Doristine Mango, DO Family Medicine PGY-2

## 2019-09-17 NOTE — ED Triage Notes (Signed)
Pt comes via [facility] from [facility] after possible seizure and twitching that started around midnight with altered mental status that is not pt baseline. Pt has a trach and vented at night time. Pt is now back to her baseline

## 2019-09-17 NOTE — Procedures (Signed)
Patient Name: Shirley Malone  MRN: [id number]  Epilepsy Attending: Lora Havens  Referring Physician/Provider: Dr. Karmen Bongo Date: 09/17/2019 Duration: 23.20 minutes  Patient history: 78 year old female with altered mental status.  EEG to evaluate for seizures.  Level of alertness:   AEDs during EEG study: None  Technical aspects: This EEG study was done with scalp electrodes positioned according to the 10-20 International system of electrode placement. Electrical activity was acquired at a sampling rate of 500Hz  and reviewed with a high frequency filter of 70Hz  and a low frequency filter of 1Hz . EEG data were recorded continuously and digitally stored.   Description: EEG showed continuous generalized 3 to 5 Hz theta-delta activity, maximal bitemporal lobe.  Triphasic waves, generalized, maximal bifrontal are also noted.  EEG was reactive to verbal stimulation.  Hyperventilation and photic stimulation was not performed.  Abnormality -Continuous slow, generalized, maximal bitemporal -Triphasic waves, generalized, maximal bifrontal  IMPRESSION: This study is suggestive of cortical dysfunction in the right temporal region as well as moderate diffuse encephalopathy, likely secondary to toxic metabolic etiology. No seizures or epileptiform discharges were seen throughout the recording.

## 2019-09-17 NOTE — Progress Notes (Signed)
EEG Completed; Results Pending  

## 2019-09-17 NOTE — Progress Notes (Signed)
Patient transported to CT and back to trauma C without complications. RN at bedside.

## 2019-09-17 NOTE — H&P (Signed)
History and Physical    Shirley Malone ZOX:[id number] DOB: 28-Nov-1941 DOA: 09/17/2019  PCP: Townsend Roger, MD  Patient coming from: [facility]; NOK: Christia Reading, [phone]  Chief Complaint: AMS  HPI: Shirley Malone is a 78 y.o. female with medical history significant of including but not limited to chronic respiratory failure s/p trach and who resides at [facility], tracheobronchomalacia, A.fib (on apixaban), dCHF, DVT RLE s/p anticoagulation and IVC filter, GI bleeding, anemia, CKD, obesity, hypothyroidism, DM2.  She was admitted to [facility] in May 2020 for RLL pseudomonas PNA and E.coli UTI (s/p 7 days merrem), RLE DVT s/p anticoagulation and IVC filter, recurrent GI bleed (Notes state she was placed on heparin and transitioned to low dose 2.69m apixaban BID as she has hx GI bleeding with higher doses of anticoagulation.  Baseline Hgb around 7 - 8.5).  She was discharged to [facility] on 05/31/19.    Recent Admission 8/18-8/20 with acute anemia. Given 2 units RBC, Gi Consulted. Suspected AVM. Held Eliquis. Transferred back to [facility].   She was readmitted 9/3-8 for hypotension, possibly related to dehydration.  She was thought to have symptomatic anemia with Hgb 7-8 and heme + stool with h/o extensive GI evaluation.  CKD with baseline creatinine 3.3.   The patient awakens to voice but is unable to follow commands.  I spoke to her son - a [place], which she had behind her right knee, and also with PNA.  She got back close to her baseline and was recommended to go to rehab/therapy - to get back to walking with a walker, using the bedside commode, develop some independence.  Last night, they told him she was jerking and she was having seizures - eyes rolling back and patient unable to respond.  Before, when she was incoherent, her CO2 was elevated and her Hgb had dropped.  But he was told those things are normal now.  She has had the  trach for about 6 years - diagnosed with severe OSA.  PEG placed about a month ago because [facility] reported aspiration.  They were in the process of trying to get her eating again, started with ice chips and has not progressed past that and due for another assessment this week.  He has been able to see her only through FBend  He reports that she is able to interact through FaceTime - he saw her yesterday but she was tired, able to "say hey" but she was falling asleep in between.  Respiratory was called to suction her due to increased secretions in her airway and that kind of wore her out.  He thinks she can get out of bed with assistance with a walker.  She may have been on the vent all the time recently due to secretions, but she usually only uses the vent at night and is on trach collar during the day.   He thinks she would want to be a full code.   She did have a prior GI bleed, became unresponsive and he "kept hollering at her" and he sent her back to the hospital.  He and his brothers were thinking about coming up to see her first thing tomorrow morning.   Sometimes she will "ignore you if she gets aggravated."  [facility]  The SNF coordinator, CVito Backers may be able to provide significant assistance.  If difficulty in returning her to the [facility]F, [facility]  may be able to [facility].   ED Course:   Long-term vent/trach - episode this AM with shaking episodes, ?seizures, altered from baseline since she usually follows commands.  No other information available.  CT unremarkable.  Stable CKD.  Called [facility] - she is SNF and they cannot care for her in the SNF.  Review of Systems: unable to perform  Past Medical History:  Diagnosis Date   Anemia    Atrial fibrillation (HCC)    Atrial fibrillation, chronic ([place]) 09/17/2019   Chronic kidney disease    Chronic respiratory failure (HCC)    Diabetes mellitus without complication ([place])    DVT (deep venous thrombosis) (HCC)     Gastrointestinal bleed    Morbid obesity (HCC)    Obesity hypoventilation syndrome (HCC)    Trilogy vent at night   Thyroid disease     Past Surgical History:  Procedure Laterality Date   PEG TUBE PLACEMENT     TRACHEOSTOMY      Social History   Socioeconomic History   Marital status: Widowed    Spouse name: Not on file   Number of children: Not on file   Years of education: Not on file   Highest education level: Not on file  Occupational History   Not on file  Social Needs   Financial resource strain: Not on file   Food insecurity    Worry: Not on file    Inability: Not on file   Transportation needs    Medical: Not on file    Non-medical: Not on file  Tobacco Use   Smoking status: Never Smoker   Smokeless tobacco: Never Used  Substance and Sexual Activity   Alcohol use: Not Currently   Drug use: Not Currently   Sexual activity: Not on file  Lifestyle   Physical activity    Days per week: Not on file    Minutes per session: Not on file   Stress: Not on file  Relationships   Social connections    Talks on phone: Not on file    Gets together: Not on file    Attends religious service: Not on file    Active member of club or organization: Not on file    Attends meetings of clubs or organizations: Not on file    Relationship status: Not on file   Intimate partner violence    Fear of current or ex partner: Not on file    Emotionally abused: Not on file    Physically abused: Not on file    Forced sexual activity: Not on file  Other Topics Concern   Not on file  Social History Narrative   Not on file    Allergies  Allergen Reactions   Clarithromycin Itching and Rash   Codeine Itching    Other reaction(s): Unknown   Latex Hives    Other reaction(s): Other Blisters    Other     adhesive on tape    No family history on file.  Prior to Admission medications   Medication Sig Start Date End Date Taking? Authorizing Provider    ACIDOPHILUS LACTOBACILLUS PO Take 1 tablet by mouth 2 (two) times daily.    [provider]  Amino Acids-Protein Hydrolys (FEEDING SUPPLEMENT, PRO-STAT SUGAR FREE 64,) LIQD Take 30 mLs by mouth every 12 (twelve) hours.    [provider]  carvedilol (COREG) 6.25 MG tablet 1 tablet (6.25 mg total) by Per J Tube route 2 (two) times daily with a  meal. 08/19/19   Shelly Coss, MD  cetirizine (ZYRTEC) 10 MG tablet Take 10 mg by mouth daily.    [provider]  chlorhexidine (PERIDEX) 0.12 % solution Use as directed 15 mLs in the mouth or throat 2 (two) times daily.    [provider]  Cholecalciferol (VITAMIN D3) 50 MCG (2000 UT) TABS Take 1 tablet by mouth daily.    [provider]  Darbepoetin Alfa (ARANESP) 25 MCG/0.42ML SOSY injection Inject 25 mcg into the skin every 7 (seven) days.    [provider]  diltiazem (CARDIZEM) 30 MG tablet Take 30 mg by mouth 2 (two) times daily.    [provider]  ferrous sulfate 325 (65 FE) MG tablet Take 325 mg by mouth daily with breakfast.    [provider]  folic acid (FOLVITE) 1 MG tablet Take 1 mg by mouth daily.    [provider]  hydrOXYzine (ATARAX/VISTARIL) 25 MG tablet Take 25 mg by mouth 3 (three) times daily as needed for itching.    [provider]  levothyroxine (SYNTHROID) 100 MCG tablet Take 100 mcg by mouth daily before breakfast.    [provider]  loperamide (IMODIUM A-D) 2 MG tablet Take 2 mg by mouth daily.    [provider]  Melatonin 3 MG TABS Take 1 tablet by mouth at bedtime.    [provider]  Multiple Vitamin (MULTIVITAMIN WITH MINERALS) TABS tablet Take 1 tablet by mouth daily.    [provider]  pantoprazole (PROTONIX) 40 MG tablet Take 1 tablet (40 mg total) by mouth 2 (two) times daily. Patient taking differently: Take 40 mg by mouth daily.  08/01/19   Shelly Coss, MD  phenol (CHLORASEPTIC) 1.4 %  LIQD Use as directed 1 spray in the mouth or throat every 12 (twelve) hours.    [provider]  sertraline (ZOLOFT) 100 MG tablet Take 100 mg by mouth daily.    [provider]    Physical Exam: Vitals:   09/17/19 0800 09/17/19 0900 09/17/19 0930 09/17/19 1015  BP: 115/77 115/72 135/75 (!) 143/79  Pulse:      Resp: _0 Temp:      TempSrc:      SpO2: 100% 100% 100% 100%  Height:          General:  Awakens to voice, no clear response to questions, on vent with trach, chronically ill  Eyes:   EOMI, normal lids, iris  ENT:  grossly normal hearing, lips & tongue, mmm  Neck:  no LAD, masses or thyromegaly; no carotid bruits  Cardiovascular:  RRR, no m/r/g. No LE edema.   Respiratory:   CTA bilaterally with no wheezes/rales/rhonchi.  Normal respiratory effort.  Abdomen:  soft, NT, ND, NABS, PEG tube in place  Skin:  no rash or induration seen on limited exam  Musculoskeletal:  decreased tone BUE/BLE, good ROM, no bony abnormality  Psychiatric:  Opens eyes to voice, stimulation; intermittent apparent hiccoughs  Neurologic:  Unable to perform    Radiological Exams on Admission: Ct Head Wo Contrast  Result Date: 09/17/2019 CLINICAL DATA:  Unexplained altered level of consciousness. EXAM: CT HEAD WITHOUT CONTRAST TECHNIQUE: Contiguous axial images were obtained from the base of the skull through the vertex without intravenous contrast. COMPARISON:  08/28/2017 FINDINGS: Brain: No evidence of acute infarction, hemorrhage, hydrocephalus, extra-axial collection or mass lesion/mass effect. 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 Mild cerebral cortical atrophy. Vascular: No hyperdense  vessel or unexpected calcification. Skull: Normal. Negative for fracture or focal lesion. Sinuses/Orbits: No acute finding. Other: None IMPRESSION: No acute abnormality. 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 Mild cerebral cortical atrophy. Electronically  Signed   By: Lorriane Shire M.D.   On: 09/17/2019 08:13   Dg Chest Portable 1 View  Result Date: 09/17/2019 CLINICAL DATA:  Altered mental status EXAM: PORTABLE CHEST 1 VIEW COMPARISON:  08/15/2019 FINDINGS: Tracheostomy tube remains well seated; over distended cuff no longer visualized. Cardiomegaly and aortic tortuosity accentuated by rightward rotation. Stable aeration. There is no edema, consolidation, effusion, or pneumothorax. IMPRESSION: No evidence of acute disease. Cardiomegaly. Electronically Signed   By: Monte Fantasia M.D.   On: 09/17/2019 05:30    EKG: Independently reviewed.  NSR with rate 59; low voltage with no evidence of acute ischemia; NSCSLT   Labs on Admission: I have personally reviewed the available labs and imaging studies at the time of the admission.  Pertinent labs:   Na++ 130; 144 on 9/7 but 133 on 9/29 K+ 5.4 Glucose 104 BUN 117/Creatinine 3.65/GFR 11; 89/3.45/12 on 9/7 AP 408 Albumin 2.7 AST 47/ALT 29 WBC 10.8 Hgb 9.9; 7.6 on 9/5 UA unremarkable ABG: 7.04/18/35.5/178.0/29.0 COVID pending; negative on 9/6, 9/3, 8/18   Assessment/Plan Principal Problem:   Encephalopathy, metabolic Active Problems:   Chronic kidney disease (CKD), active medical management without dialysis, stage 5 (HCC)   Chronic respiratory failure with hypoxia (HCC)   Dysphagia   Type 2 diabetes mellitus without complication (HCC)   Hypothyroidism (acquired)   DVT (deep venous thrombosis) (HCC)   Atrial fibrillation, chronic ([place])   Hiccoughs   Metabolic encephalopathy -Patient with uncertain baseline - son describes walking/talking, but this does not appear to be consistent with prior hospitalization and certainly not with current appearance -Will observe for now -No clear seizure activity observed here, but will request EEG and if abnormal will consult neurology; otherwise, will monitor without clear intervention at this time -Uremia is a consideration (see below), although  renal function does not appear to be significantly different from prior -Based on progress, ongoing goals of care discussion and consideration of palliative care consultation may be reasonable -Her Alk Phos is quite high but has been recently elevated; may consider further evaluation  Hiccoughs -Patient with episodic hiccoughs while in the ER -Per nursing report, the patient improved with thorazine -Hiccoughs had recurred during my evaluation and so additional thorazine was prescribed -It is possible that the hiccough behavior created the concern for "shaking"  CKD -Patient with advanced/stage 5 CKD -She does not appear to be a good candidate for HD, but this is a more appropriate consideration by nephrology -Nephrology consultation requested -Renal US ordered -She does not appear likely uremic based on general stability of renal function -There may be a mild AKI component (hyponatremia, hyperkalemia, elevated BUN) and she has received 1L and will be started on MIVF  Afib -NSR and rate controlled at this time -Hold Dilt due to marginal BP/HR and continue Coreg for now -Previously on Eliquis, but this was held due to recent GI bleeding  H/o DVT -No apparent respiratory symptoms at this time -No LE findings concerning for recurrent DVT -Off Eliquis due to GI bleeding -Will start Heparin for DVT prevention  Dysphagia -patient with reported h/o dysphagia -Her son reports that she was requiring deep suctioning yesterday -She has reportedly been eating ice chips at the facility -Will keep NPO for now (including TF) and request ST consult both  for cognitive/language evaluation as well as swallow evaluation  DM -Recent A1c was 5.5 -Diet controlled -No indication for testing/treating at this time  Hypothyroidism -Normal TSH on 8/19 -Continue Synthroid at current dose for now    Note: This patient has been tested and is pending for the novel coronavirus COVID-19.  DVT  prophylaxis: Heparin Code Status:  Full - confirmed with family Family Communication: I spoke with her son by telephone Disposition Plan: SNF Consults called: Nephrology Admission status: It is my clinical opinion that referral for OBSERVATION is reasonable and necessary in this patient based on the above information provided. The aforementioned taken together are felt to place the patient at high risk for further clinical deterioration. However it is anticipated that the patient may be medically stable for discharge from the hospital within 24 to 48 hours.    Karmen Bongo MD [facility]   How to contact the [facility] Attending or Consulting provider [place] or covering provider during after hours [place], for this patient?  1. Check the care team in [facility] and look for a) attending/consulting [facility] provider listed and b) the [facility] team listed 2. Log into [email or web address] and use 's universal password to access. If you do not have the password, please contact the hospital operator. 3. Locate the [facility] provider you are looking for under [facility] and page to a number that you can be directly reached. 4. If you still have difficulty reaching the provider, please page the [facility] (Director on Call) for the Hospitalists listed on amion for assistance.   09/17/2019, 10:55 AM

## 2019-09-17 NOTE — Consult Note (Addendum)
[facility] Renal Consultation Note    Indication for Consultation:  Management of advanced CKD; anemia, hypertension/volume and secondary hyperparathyroidism PCP: Dr. Nona Dell  HPI: Shirley Malone is a 78 y.o. female with PMH of CKD stage 4, chronic respiratory failure with tracheostomy, vent dependent, Afib/DVT on Eliquis, HFpEF, GI bleeding, hypothyroidism, AOCD. Patient admitted from [place], followed by Dr. Nona Dell. She presented to ED today with metabolic encephalopathy. Seizures reported at [facility] but none since here. Recent admission 09/03-09/06/2019 for hypotension, symptomatic anemia. SCr ranged from 4.36 on admission  to 3.45 on discharge with EGFR 11-14.   Upon arrival to ED, T-97.4 BP 115/77 RR 19 O2 sats 19 on vent. Na 142 K+ 4.2 CO2 27 BS 104 BUN 117 SCr 3.72 EGFR 13 BS 104. WBC 10.8 HGB 9.9 PLT 219. AGAS-pH 7.5 PO2 178 PCo2 36.5 HCO 29CT of head without acute abnormalities. CXR without evidence of volume overload, basically unremarkable. UA unremarkable.   Currently, she is unresponsive, unable to contribute to HPI. Spoke with son, Cornelia Copa who said he attempted to FaceTime with patient yesterday but visit was shortened due to need for suctioning and patient being tired but did say she seems to know who he was. Otherwise HPI gathered from medical record. She has been admitted per primary for work up for acute metabolic encephalopathy.    Past Medical History:  Diagnosis Date  . Anemia   . Atrial fibrillation ([place])   . Atrial fibrillation, chronic ([place]) 09/17/2019  . Chronic kidney disease   . Chronic respiratory failure ([place])   . Diabetes mellitus without complication ([place])   . DVT (deep venous thrombosis) ([place])   . Gastrointestinal bleed   . Morbid obesity ([place])   . 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 Thyroid disease    Past Surgical History:  Procedure Laterality Date  . PEG TUBE PLACEMENT    . TRACHEOSTOMY     No  family history on file. Social History:  reports that she has never smoked. She has never used smokeless tobacco. She reports previous alcohol use. She reports previous drug use. Allergies  Allergen Reactions  . Clarithromycin Itching and Rash  . Codeine Itching    Other reaction(s): Unknown  . Latex Hives    Other reaction(s): Other Blisters   . Other     adhesive on tape   Prior to Admission medications   Medication Sig Start Date End Date Taking? Authorizing Provider  ACIDOPHILUS LACTOBACILLUS PO Take 1 tablet by mouth 2 (two) times daily.    [provider]  Amino Acids-Protein Hydrolys (FEEDING SUPPLEMENT, PRO-STAT SUGAR FREE 64,) LIQD Take 30 mLs by mouth every 12 (twelve) hours.    [provider]  carvedilol (COREG) 6.25 MG tablet 1 tablet (6.25 mg total) by Per J Tube route 2 (two) times daily with a meal. 08/19/19   Shelly Coss, MD  cetirizine (ZYRTEC) 10 MG tablet Take 10 mg by mouth daily.    [provider]  chlorhexidine (PERIDEX) 0.12 % solution Use as directed 15 mLs in the mouth or throat 2 (two) times daily.    [provider]  Cholecalciferol (VITAMIN D3) 50 MCG (2000 UT) TABS Take 1 tablet by mouth daily.    [provider]  Darbepoetin Alfa (ARANESP) 25 MCG/0.42ML SOSY injection Inject 25 mcg into the skin every 7 (seven) days.    [provider]  diltiazem (CARDIZEM) 30 MG tablet Take 30 mg by mouth  2 (two) times daily.    [provider]  ferrous sulfate 325 (65 FE) MG tablet Take 325 mg by mouth daily with breakfast.    [provider]  folic acid (FOLVITE) 1 MG tablet Take 1 mg by mouth daily.    [provider]  hydrOXYzine (ATARAX/VISTARIL) 25 MG tablet Take 25 mg by mouth 3 (three) times daily as needed for itching.    [provider]  levothyroxine (SYNTHROID) 100 MCG tablet Take 100 mcg by mouth daily before breakfast.    [provider]  loperamide (IMODIUM  A-D) 2 MG tablet Take 2 mg by mouth daily.    [provider]  Melatonin 3 MG TABS Take 1 tablet by mouth at bedtime.    [provider]  Multiple Vitamin (MULTIVITAMIN WITH MINERALS) TABS tablet Take 1 tablet by mouth daily.    [provider]  pantoprazole (PROTONIX) 40 MG tablet Take 1 tablet (40 mg total) by mouth 2 (two) times daily. Patient taking differently: Take 40 mg by mouth daily.  08/01/19   Shelly Coss, MD  phenol (CHLORASEPTIC) 1.4 % LIQD Use as directed 1 spray in the mouth or throat every 12 (twelve) hours.    [provider]  sertraline (ZOLOFT) 100 MG tablet Take 100 mg by mouth daily.    [provider]   Current Facility-Administered Medications  Medication Dose Route Frequency Provider Last Rate Last Dose  . 0.9 %  sodium chloride infusion  75 mL/hr Intravenous Continuous Karmen Bongo, MD 75 mL/hr at 09/17/19 1141 75 mL/hr at 09/17/19 1141  . acetaminophen (TYLENOL) tablet 650 mg  650 mg Oral Q6H PRN Karmen Bongo, MD       Or  . acetaminophen (TYLENOL) suppository 650 mg  650 mg Rectal Q6H PRN Karmen Bongo, MD      . calcium carbonate (dosed in mg elemental calcium) suspension 500 mg of elemental calcium  500 mg of elemental calcium Oral Q6H PRN Karmen Bongo, MD      . camphor-menthol [facility]) lotion 1 application  1 application Topical D6U PRN Karmen Bongo, MD       And  . hydrOXYzine (ATARAX/VISTARIL) tablet 25 mg  25 mg Oral Q8H PRN Karmen Bongo, MD      . carvedilol (COREG) tablet 6.25 mg  6.25 mg Per J Tube BID WC Karmen Bongo, MD      . chlorhexidine (PERIDEX) 0.12 % solution 15 mL  15 mL Mouth/Throat BID Karmen Bongo, MD   15 mL at 09/17/19 1154  . chlorproMAZINE (THORAZINE) tablet 25 mg  25 mg Oral QID PRN Karmen Bongo, MD      . docusate sodium [facility]) enema 283 mg  1 enema Rectal PRN Karmen Bongo, MD      . feeding supplement (NEPRO CARB STEADY) liquid 237 mL  237 mL Oral TID PRN  Karmen Bongo, MD      . heparin injection 5,000 Units  5,000 Units Subcutaneous Lynne Logan, MD      . Derrill Memo ON 09/18/2019] levothyroxine (SYNTHROID) tablet 100 mcg  100 mcg Oral QAC breakfast Karmen Bongo, MD      . loratadine (CLARITIN) tablet 10 mg  10 mg Oral Daily Karmen Bongo, MD   10 mg at 09/17/19 1142  . LORazepam (ATIVAN) injection 1-2 mg  1-2 mg Intravenous Q2H PRN Karmen Bongo, MD      . ondansetron [facility]) tablet 4 mg  4 mg Oral Q6H PRN Karmen Bongo, MD  Or  . ondansetron (ZOFRAN) injection 4 mg  4 mg Intravenous Q6H PRN Karmen Bongo, MD      . pantoprazole (PROTONIX) EC tablet 40 mg  40 mg Oral BID Karmen Bongo, MD   40 mg at 09/17/19 1141  . sertraline (ZOLOFT) tablet 100 mg  100 mg Oral Daily Karmen Bongo, MD   100 mg at 09/17/19 1154  . sorbitol 70 % solution 30 mL  30 mL Oral PRN Karmen Bongo, MD      . zolpidem Lorrin Mais) tablet 5 mg  5 mg Oral QHS PRN Karmen Bongo, MD       Current Outpatient Medications  Medication Sig Dispense Refill  . ACIDOPHILUS LACTOBACILLUS PO Take 1 tablet by mouth 2 (two) times daily.    . Amino Acids-Protein Hydrolys (FEEDING SUPPLEMENT, PRO-STAT SUGAR FREE 64,) LIQD Take 30 mLs by mouth every 12 (twelve) hours.    . carvedilol (COREG) 6.25 MG tablet 1 tablet (6.25 mg total) by Per J Tube route 2 (two) times daily with a meal.    . cetirizine (ZYRTEC) 10 MG tablet Take 10 mg by mouth daily.    . chlorhexidine (PERIDEX) 0.12 % solution Use as directed 15 mLs in the mouth or throat 2 (two) times daily.    . Cholecalciferol (VITAMIN D3) 50 MCG (2000 UT) TABS Take 1 tablet by mouth daily.    . Darbepoetin Alfa (ARANESP) 25 MCG/0.42ML SOSY injection Inject 25 mcg into the skin every 7 (seven) days.    Marland Kitchen diltiazem (CARDIZEM) 30 MG tablet Take 30 mg by mouth 2 (two) times daily.    . ferrous sulfate 325 (65 FE) MG tablet Take 325 mg by mouth daily with breakfast.    . folic acid (FOLVITE) 1 MG tablet Take 1 mg  by mouth daily.    . hydrOXYzine (ATARAX/VISTARIL) 25 MG tablet Take 25 mg by mouth 3 (three) times daily as needed for itching.    . levothyroxine (SYNTHROID) 100 MCG tablet Take 100 mcg by mouth daily before breakfast.    . loperamide (IMODIUM A-D) 2 MG tablet Take 2 mg by mouth daily.    . Melatonin 3 MG TABS Take 1 tablet by mouth at bedtime.    . Multiple Vitamin (MULTIVITAMIN WITH MINERALS) TABS tablet Take 1 tablet by mouth daily.    . pantoprazole (PROTONIX) 40 MG tablet Take 1 tablet (40 mg total) by mouth 2 (two) times daily. (Patient taking differently: Take 40 mg by mouth daily. )    . phenol (CHLORASEPTIC) 1.4 % LIQD Use as directed 1 spray in the mouth or throat every 12 (twelve) hours.    . sertraline (ZOLOFT) 100 MG tablet Take 100 mg by mouth daily.     Labs: Basic Metabolic Panel: Recent Labs  Lab 09/17/19 0455 09/17/19 0653  NA 130* 129*  K 5.4* 4.4  CL 88*  --   CO2 27  --   GLUCOSE 104*  --   BUN 117*  --   CREATININE 3.65*  --   CALCIUM 10.0  --    Liver Function Tests: Recent Labs  Lab 09/17/19 0455  AST 47*  ALT 29  ALKPHOS 408*  BILITOT 0.8  PROT 7.8  ALBUMIN 2.7*   No results for input(s): LIPASE, AMYLASE in the last 168 hours. No results for input(s): AMMONIA in the last 168 hours. CBC: Recent Labs  Lab 09/17/19 0455 09/17/19 0653  WBC 10.8*  --   NEUTROABS 7.9*  --  HGB 9.9* 9.5*  HCT 29.6* 28.0*  MCV 84.1  --   PLT 219  --    Cardiac Enzymes: No results for input(s): CKTOTAL, CKMB, CKMBINDEX, TROPONINI in the last 168 hours. CBG: Recent Labs  Lab 09/17/19 1025  GLUCAP 83   Iron Studies: No results for input(s): IRON, TIBC, TRANSFERRIN, FERRITIN in the last 72 hours. Studies/Results: Ct Head Wo Contrast  Result Date: 09/17/2019 CLINICAL DATA:  Unexplained altered level of consciousness. EXAM: CT HEAD WITHOUT CONTRAST TECHNIQUE: Contiguous axial images were obtained from the base of the skull through the vertex without  intravenous contrast. COMPARISON:  08/28/2017 FINDINGS: Brain: No evidence of acute infarction, hemorrhage, hydrocephalus, extra-axial collection or mass lesion/mass effect. 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 Mild cerebral cortical atrophy. Vascular: No hyperdense vessel or unexpected calcification. Skull: Normal. Negative for fracture or focal lesion. Sinuses/Orbits: No acute finding. Other: None IMPRESSION: No acute abnormality. 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 Mild cerebral cortical atrophy. Electronically Signed   By: Lorriane Shire M.D.   On: 09/17/2019 08:13   Dg Chest Portable 1 View  Result Date: 09/17/2019 CLINICAL DATA:  Altered mental status EXAM: PORTABLE CHEST 1 VIEW COMPARISON:  08/15/2019 FINDINGS: Tracheostomy tube remains well seated; over distended cuff no longer visualized. Cardiomegaly and aortic tortuosity accentuated by rightward rotation. Stable aeration. There is no edema, consolidation, effusion, or pneumothorax. IMPRESSION: No evidence of acute disease. Cardiomegaly. Electronically Signed   By: Monte Fantasia M.D.   On: 09/17/2019 05:30    ROS: As per HPI otherwise negative.   Physical Exam: Vitals:   09/17/19 0930 09/17/19 1015 09/17/19 1100 09/17/19 1108  BP: 135/75 (!) 143/79 133/74   Pulse:      Resp: _0 Temp:      TempSrc:      SpO2: 100% 100% 100% 99%  Height:         General: Chronically ill appearing female with trach to vent, NAD. Head: Normocephalic, atraumatic, sclera non-icteric, mucus membranes are moist Neck: Supple. JVD not elevated. Lungs: Trach with bilateral breath sounds, symmetrical chest excursions essentially clear to ausc. FiO2 40% Sats 100% assisting vent.  Heart: RRR with S1 S2. No murmurs, rubs, or gallops appreciated. Abdomen: Soft, non-tender, non-distended with normoactive bowel sounds.Peg mid abdomen. M-S:  Is not moving extremities to commands at present. Lower extremities:without edema or  ischemic changes, no open wounds  Neuro: Opens eyes to tactile/verbal stimuli, does not follow commands.  Psych:  Unable to assess.   Assessment/Plan: 1.  Acute Metabolic Encephalopathy-WU per primary. Possible uremia but labs are not hugely different from last adm. Per primary.  2. Reported seizure activity-no seizure activity reported here. EEG in progress, per primary.  3. Chronic vent dependent RF-H/O tracheobronchomalacia.  per primary 4.  CKD stage 5 -Mostly likely NOT an appropriate candidate for HD due to multiple co-morbid conditions/debility. Does not have evidence of volume overload by exam. SCr seems stable. Avoid hypotension. Also suggest renal tube feedings with lower protein to possibly decrease BUN.  5.  Hypertension/volume  - BP well controlled on low dose carvedilol. No evidence of volume overload.  6.  Anemia  - HGB 9.9. Check iron panel.  7.  Metabolic bone disease -  Ca 10 C Ca 11.04 Stop Calcium carb. Phos at goal. Check PTH.  8.  Nutrition - Peg with TF on hold at present. Would suggest lower protein feedings when resumed 9. DM-per primary 10. Afib-SR on  monitor. On Eliquis.  Rita H. Owens Shark, NP-C 09/17/2019, 12:18 PM  [employer] [phone]   I have personally seen and examined this patient and agree with the assessment/plan as outlined above.  Chronically vent/ trach dependent, azotemia and acute metabolic encephalopathy.  Exceptionally poor dialysis candidate d/t functional status and is unlikely to improve QOL.  On IVFs, recommend nepro as TF instead of high protein/ high osm TF.  Renal US with medicorenal disease and no hydronephrosis.  Workup of possible seizure activity per neuro.  Herma Mering 09/17/2019 4:36 PM

## 2019-09-18 DIAGNOSIS — Z86718 Personal history of other venous thrombosis and embolism: Secondary | ICD-10-CM | POA: Diagnosis not present

## 2019-09-18 DIAGNOSIS — E8889 Other specified metabolic disorders: Secondary | ICD-10-CM | POA: Diagnosis not present

## 2019-09-18 DIAGNOSIS — Z930 Tracheostomy status: Secondary | ICD-10-CM

## 2019-09-18 DIAGNOSIS — J9611 Chronic respiratory failure with hypoxia: Secondary | ICD-10-CM

## 2019-09-18 DIAGNOSIS — G9341 Metabolic encephalopathy: Secondary | ICD-10-CM

## 2019-09-18 DIAGNOSIS — Z6833 Body mass index (BMI) 33.0-33.9, adult: Secondary | ICD-10-CM | POA: Diagnosis not present

## 2019-09-18 DIAGNOSIS — R1310 Dysphagia, unspecified: Secondary | ICD-10-CM | POA: Diagnosis not present

## 2019-09-18 DIAGNOSIS — J9809 Other diseases of bronchus, not elsewhere classified: Secondary | ICD-10-CM

## 2019-09-18 DIAGNOSIS — Z931 Gastrostomy status: Secondary | ICD-10-CM | POA: Diagnosis not present

## 2019-09-18 DIAGNOSIS — E875 Hyperkalemia: Secondary | ICD-10-CM | POA: Diagnosis present

## 2019-09-18 DIAGNOSIS — E039 Hypothyroidism, unspecified: Secondary | ICD-10-CM | POA: Diagnosis not present

## 2019-09-18 DIAGNOSIS — J398 Other specified diseases of upper respiratory tract: Secondary | ICD-10-CM | POA: Diagnosis not present

## 2019-09-18 DIAGNOSIS — I132 Hypertensive heart and chronic kidney disease with heart failure and with stage 5 chronic kidney disease, or end stage renal disease: Secondary | ICD-10-CM | POA: Diagnosis not present

## 2019-09-18 DIAGNOSIS — R251 Tremor, unspecified: Secondary | ICD-10-CM | POA: Diagnosis not present

## 2019-09-18 DIAGNOSIS — N185 Chronic kidney disease, stage 5: Secondary | ICD-10-CM

## 2019-09-18 DIAGNOSIS — E1122 Type 2 diabetes mellitus with diabetic chronic kidney disease: Secondary | ICD-10-CM | POA: Diagnosis not present

## 2019-09-18 DIAGNOSIS — I482 Chronic atrial fibrillation, unspecified: Secondary | ICD-10-CM | POA: Diagnosis not present

## 2019-09-18 DIAGNOSIS — D631 Anemia in chronic kidney disease: Secondary | ICD-10-CM | POA: Diagnosis not present

## 2019-09-18 DIAGNOSIS — R4182 Altered mental status, unspecified: Secondary | ICD-10-CM | POA: Diagnosis not present

## 2019-09-18 DIAGNOSIS — N2581 Secondary hyperparathyroidism of renal origin: Secondary | ICD-10-CM | POA: Diagnosis not present

## 2019-09-18 DIAGNOSIS — N19 Unspecified kidney failure: Secondary | ICD-10-CM | POA: Diagnosis present

## 2019-09-18 DIAGNOSIS — Z20828 Contact with and (suspected) exposure to other viral communicable diseases: Secondary | ICD-10-CM | POA: Diagnosis not present

## 2019-09-18 DIAGNOSIS — I5032 Chronic diastolic (congestive) heart failure: Secondary | ICD-10-CM | POA: Diagnosis not present

## 2019-09-18 DIAGNOSIS — R569 Unspecified convulsions: Secondary | ICD-10-CM | POA: Diagnosis not present

## 2019-09-18 DIAGNOSIS — G4733 Obstructive sleep apnea (adult) (pediatric): Secondary | ICD-10-CM | POA: Diagnosis not present

## 2019-09-18 DIAGNOSIS — Z9911 Dependence on respirator [ventilator] status: Secondary | ICD-10-CM | POA: Diagnosis not present

## 2019-09-18 LAB — CBC
HCT: 28.1 % — ABNORMAL LOW (ref 36.0–46.0)
Hemoglobin: 8.9 g/dL — ABNORMAL LOW (ref 12.0–15.0)
MCH: 27 pg (ref 26.0–34.0)
MCHC: 31.7 g/dL (ref 30.0–36.0)
MCV: 85.2 fL (ref 80.0–100.0)
Platelets: 284 10*3/uL (ref 150–400)
RBC: 3.3 MIL/uL — ABNORMAL LOW (ref 3.87–5.11)
RDW: 15.7 % — ABNORMAL HIGH (ref 11.5–15.5)
WBC: 8.1 10*3/uL (ref 4.0–10.5)
nRBC: 0 % (ref 0.0–0.2)

## 2019-09-18 LAB — IRON AND TIBC
Iron: 36 ug/dL (ref 28–170)
Saturation Ratios: 19 % (ref 10.4–31.8)
TIBC: 193 ug/dL — ABNORMAL LOW (ref 250–450)
UIBC: 157 ug/dL

## 2019-09-18 LAB — COMPREHENSIVE METABOLIC PANEL
ALT: 26 U/L (ref 0–44)
AST: 34 U/L (ref 15–41)
Albumin: 2.5 g/dL — ABNORMAL LOW (ref 3.5–5.0)
Alkaline Phosphatase: 343 U/L — ABNORMAL HIGH (ref 38–126)
Anion gap: 11 (ref 5–15)
BUN: 104 mg/dL — ABNORMAL HIGH (ref 8–23)
CO2: 25 mmol/L (ref 22–32)
Calcium: 9.6 mg/dL (ref 8.9–10.3)
Chloride: 97 mmol/L — ABNORMAL LOW (ref 98–111)
Creatinine, Ser: 3.65 mg/dL — ABNORMAL HIGH (ref 0.44–1.00)
GFR calc Af Amer: 13 mL/min — ABNORMAL LOW (ref >60)
GFR calc non Af Amer: 11 mL/min — ABNORMAL LOW (ref >60)
Glucose, Bld: 97 mg/dL (ref 70–99)
Potassium: 4.5 mmol/L (ref 3.5–5.1)
Sodium: 133 mmol/L — ABNORMAL LOW (ref 135–145)
Total Bilirubin: 0.4 mg/dL (ref 0.3–1.2)
Total Protein: 7.2 g/dL (ref 6.5–8.1)

## 2019-09-18 LAB — FERRITIN: Ferritin: 763 ng/mL — ABNORMAL HIGH (ref 11–307)

## 2019-09-18 LAB — GLUCOSE, CAPILLARY
Glucose-Capillary: 91 mg/dL (ref 70–99)
Glucose-Capillary: 99 mg/dL (ref 70–99)

## 2019-09-18 NOTE — Plan of Care (Signed)
  Problem: Skin Integrity: Goal: Risk for impaired skin integrity will decrease Outcome: Not Progressing  Pt continues to be itchy and scratching at her chest, causing the skin to break despite putting on Sarna lotion and giving vistaril PRN during this shift.

## 2019-09-18 NOTE — Progress Notes (Signed)
PCCM Progress Note  Name: Shirley Malone DOB: 1941/11/24 MRN: [id number] Admission date: 09/17/2019 Consult date: 09/17/2019  Brief History: 79 yo female resident of [facility] brought to [facility] for assessment of altered mental status.  She has chronic trach, vent support and PCCM asked to assist with trach/vent management.  Past Medical History: Tracheobronchomalacia, Chronic respiratory failure, Tracheostomy status, A fib, Diastolic CHF, DVT Rt Leg s/p IVC filter, GI bleed, CKD, Hypothyroidism, DM  Subjective: Denies chest pain, abdominal pain.  Vital signs: BP 116/61   Pulse 60   Temp 98.6 F (37 C) (Axillary)   Resp 18   Ht 5\' 1"  (1.549 m)   Wt 81.4 kg   SpO2 100%   BMI 33.91 kg/m   Intake/output: I/O last 3 completed shifts: In: 1740.2 [I.V.:690.2; Other:50; IV Piggyback:1000] Out: 1050 [Urine:1050]  Physical exam: General - alert Eyes - pupils reactive ENT - trach site clean Cardiac - regular rate/rhythm, no murmur Chest - equal breath sounds b/l, no wheezing or rales Abdomen - soft, non tender, + bowel sounds Extremities - no cyanosis, clubbing, or edema Skin - no rashes Neuro - follows commands appropriately, moves all extremities  Labs: CMP Latest Ref Rng & Units 09/18/2019 09/17/2019 09/17/2019  Glucose 70 - 99 mg/dL 97 - 104(H)  BUN 8 - 23 mg/dL 104(H) - 117(H)  Creatinine 0.44 - 1.00 mg/dL 3.65(H) - 3.65(H)  Sodium 135 - 145 mmol/L 133(L) 129(L) 130(L)  Potassium 3.5 - 5.1 mmol/L 4.5 4.4 5.4(H)  Chloride 98 - 111 mmol/L 97(L) - 88(L)  CO2 22 - 32 mmol/L 25 - 27  Calcium 8.9 - 10.3 mg/dL 9.6 - 10.0  Total Protein 6.5 - 8.1 g/dL 7.2 - 7.8  Total Bilirubin 0.3 - 1.2 mg/dL 0.4 - 0.8  Alkaline Phos 38 - 126 U/L 343(H) - 408(H)  AST 15 - 41 U/L 34 - 47(H)  ALT 0 - 44 U/L 26 - 29    CBC Latest Ref Rng & Units 09/18/2019 09/17/2019 09/17/2019  WBC 4.0 - 10.5 K/uL 8.1 - 10.8(H)  Hemoglobin 12.0 - 15.0 g/dL 8.9(L) 9.5(L) 9.9(L)  Hematocrit 36.0 - 46.0 % 28.1(L)  28.0(L) 29.6(L)  Platelets 150 - 400 K/uL 284 - 219    CBG (last 3)  Recent Labs    09/17/19 1949 09/17/19 2355 09/18/19 0322  GLUCAP 90 95 91    ABG    Component Value Date/Time   PHART 7.507 (H) 09/17/2019 0653   PCO2ART 36.5 09/17/2019 0653   PO2ART 178.0 (H) 09/17/2019 0653   HCO3 29.0 (H) 09/17/2019 0653   TCO2 30 09/17/2019 0653   ACIDBASEDEF 6.0 (H) 07/04/2019 0103   O2SAT 100.0 09/17/2019 0653    Studies: CT head 10/06 - old lacunar infarct Rt IC, mild atrophy EEG 10/06 - generalized slowing, triphasic waves CXR reviewed by me - cardiomegaly  Micro Data: SARS 2 10/06 - negative  Assessment/plan:  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 Hiccoughs. Tracheostomy status. - pressure support wean as tolerated - trach care - if he remains in [facility], then can have speech therapy assess for PM valve - f/u CXR intermittently - prn thorazine  Altered mental status. - improved 10/07 - per primary team  CKD 5. - nephrology consulted - felt to not be an appropriate candidate for iHD due to comorbid conditions  Hx of A fib, DVT, HTN. - off anticoagulation with Hx of GI bleeding  Hx of hypothyroidism. - continue synthroid  Anemia of chronic disease. - f/u CBC  intermittently - transfuse for Hb < 7 or significant bleeding  Best Practice: DVT prophylaxis - SQ heparin SUP - protonix Mobility - Bed rest Goals of care - Full code Disposition - can transfer back to [facility] when okay with primary team  PCCM will f/u intermittently while she is in hospital.  Call if help needed in between.  Time spent 31 minutes.  Chesley Mires, MD [facility] Pulmonary/Critical Care 09/18/2019, 8:31 AM

## 2019-09-18 NOTE — TOC Initial Note (Signed)
Transition of Care [facility]) - Initial/Assessment Note    Patient Details  Name: Myrene Carini MRN: [id number] Date of Birth: 11-10-1941  [facility] of Care [facility] CM/SW Contact:    Bartholomew Crews, RN Phone Number: 09/18/2019, 10:58 AM  Clinical Narrative:                 Patient admitted for observation from [facility] where she is a long term resident on the NH side. Chronic trach - nocturnal vent - peg. Medically cleared to return to [facility] today. Patient to return to her room 307 under care of Dr. Nona Dell. Nurse may call report to [phone]. Will arrange [facility] transport. DC summary and covid test to be faxed to [facility].   Expected Discharge Plan: Long Term Nursing Home Barriers to Discharge: No Barriers Identified   Patient Goals and CMS Choice Patient states their goals for this hospitalization and ongoing recovery are:: will return to [facility].gov Compare Post Acute Care list provided to:: Patient Represenative (must comment)(Eugene Giannini) Choice offered to / list presented to : Adult Children  Expected Discharge Plan and Services Expected Discharge Plan: Long Term Nursing Home In-house Referral: NA Discharge Planning Services: CM Consult Post Acute Care Choice: Nursing Home Living arrangements for the past 2 months: Post-Acute Facility Expected Discharge Date: 09/18/19               DME Arranged: N/A DME Agency: NA       HH Arranged: NA HH Agency: NA        Prior Living Arrangements/Services Living arrangements for the past 2 months: [place] Lives with:: Facility Resident                Criminal Activity/Legal Involvement Pertinent to Current Situation/Hospitalization: No - Comment as needed  Activities of Daily Living      Permission Sought/Granted                  Emotional Assessment              Admission diagnosis:  Hyperkalemia [E87.5] Uremia [N19] CKD (chronic kidney disease) stage 5, GFR less than 15  ml/min (HCC) [N18.5] Seizure-like activity ([place]) [R56.9] Altered mental status, unspecified altered mental status type [R41.82] Patient Active Problem List   Diagnosis Date Noted  . Encephalopathy, metabolic [id number]  . Chronic kidney disease (CKD), active medical management without dialysis, stage 5 ([place]) 09/17/2019  . Chronic respiratory failure with hypoxia ([place]) 09/17/2019  . Dysphagia 09/17/2019  . Type 2 diabetes mellitus without complication ([place]) [id number]  . Hypothyroidism (acquired) 09/17/2019  . DVT (deep venous thrombosis) ([place]) 09/17/2019  . Atrial fibrillation, chronic ([place]) 09/17/2019  . Hiccoughs 09/17/2019  . Altered mental status 09/17/2019  . Pressure injury of skin 08/17/2019  . AKI (acute kidney injury) ([place]) 08/16/2019  . Hypotension 08/15/2019  . ARF (acute renal failure) ([place]) 08/15/2019  . Hyperkalemia 08/15/2019  . GI bleed 08/01/2019  . Symptomatic anemia   . Anticoagulated   . Acute blood loss anemia 07/30/2019  . Respiratory failure ([place])    PCP:  Townsend Roger, MD Pharmacy:  No Pharmacies Listed    Social Determinants of Health (SDOH) Interventions    Readmission Risk Interventions No flowsheet data found.

## 2019-09-18 NOTE — Discharge Summary (Signed)
Physician Discharge Summary  Melonee Kenner [id number] DOB: [birth year] DOA: 09/17/2019  PCP: Townsend Roger, MD  Admit date: 09/17/2019 Discharge date: 09/18/2019  [facility] From: [facility]  Recommendations for Outpatient Follow-up:  1. Follow up with PCP in 1-2 weeks 2. Recommend repeat BMET in 1-2 weeks 3. Please ensure patient stays well-hydrated, concerns for element of dehydration as pt's mentation improved after gentle IVF overnight 4. Also recommend minimizing sedating medications as possible  Discharge Condition:Improved CODE STATUS:Full Diet recommendation: Tube feeding as tolerated   Brief/Interim Summary: 78 y.o. female with medical history significant of including but not limited to chronic respiratory failure s/p trach and who resides at [facility], tracheobronchomalacia, A.fib (on apixaban), dCHF, DVT RLE s/p anticoagulation and IVC filter, GI bleeding, anemia, CKD, obesity, hypothyroidism, DM2. Shewas admitted to [facility] in May 2020 for RLL pseudomonas PNA and E.coli UTI (s/p 7 days merrem), RLE DVT s/p anticoagulation and IVC filter, recurrent GI bleed (Notes state she was placed on heparin and transitioned to low dose 2.5mg  apixaban BID as she has hx GI bleeding with higher doses of anticoagulation. Baseline Hgb around 7 - 8.5). She was discharged to [facility] on 05/31/19.   Recent Admission 8/18-8/20 with acute anemia. Given 2 units RBC, Gi Consulted. Suspected AVM. Held Eliquis. Transferred back to [facility].   She was readmitted 9/3-8 for hypotension, possibly related to dehydration.  She was thought to have symptomatic anemia with Hgb 7-8 and heme + stool with h/o extensive GI evaluation.  CKD with baseline creatinine 3.3.   The patient awakens to voice but is unable to follow commands.  I spoke to her son - a while back she went to a hospital in [place], [place] to check for a DVT, which she had behind her right knee, and also with PNA.  She got  back close to her baseline and was recommended to go to rehab/therapy - to get back to walking with a walker, using the bedside commode, develop some independence.  Last night, they told him she was jerking and she was having seizures - eyes rolling back and patient unable to respond.  Before, when she was incoherent, her CO2 was elevated and her Hgb had dropped.  But he was told those things are normal now.  She has had the trach for about 6 years - diagnosed with severe OSA.  PEG placed about a month ago because [facility] reported aspiration.  They were in the process of trying to get her eating again, started with ice chips and has not progressed past that and due for another assessment this week.  He has been able to see her only through [place].  He reports that she is able to interact through FaceTime - he saw her yesterday but she was tired, able to "say hey" but she was falling asleep in between.  Respiratory was called to suction her due to increased secretions in her airway and that kind of wore her out.  He thinks she can get out of bed with assistance with a walker.  She may have been on the vent all the time recently due to secretions, but she usually only uses the vent at night and is on trach collar during the day.   He thinks she would want to be a full code.   She did have a prior GI bleed, became unresponsive and he "kept hollering at her" and he sent her back to the hospital.  He and his brothers were thinking about  coming up to see her first thing tomorrow morning.   Sometimes she will "ignore you if she gets aggravated."  Discharge Diagnoses:  Principal Problem:   Encephalopathy, metabolic Active Problems:   Chronic kidney disease (CKD), active medical management without dialysis, stage 5 (HCC)   Chronic respiratory failure with hypoxia ([place])   Dysphagia   Type 2 diabetes mellitus without complication (HCC)   Hypothyroidism (acquired)   DVT (deep venous thrombosis) (HCC)   Atrial  fibrillation, chronic (HCC)   Hiccoughs   Altered mental status    Metabolic encephalopathy --No clear seizure activity observed here, EEG performed, findings consistent with diffuse encephalopathy -Seen by Nephrology, see below -Mentation improved after receiving gentle IVF overnight  Hiccoughs -Patient with episodic hiccoughs while in the ER -Per nursing report, the patient improved with thorazine -It is possible that the hiccough behavior created the concern for "shaking"  CKD -Patient with advanced/stage 5 CKD -She does not appear to be a good candidate for HD, but this is a more appropriate consideration by nephrology -Nephrology consulted. Pt deemed not candidate for HD given comorbidities -Renal US ordered and reviewed. Mild increased echogenicity within R kidney, no obstructive changes noted -Renal function remained stable  Afib -NSR and rate controlled at this time -Holding Dilt due to marginal BP/HR and continue Coreg for now -Previously on Eliquis, but this was held due to recent GI bleeding  H/o DVT -No apparent respiratory symptoms at this time -No LE findings concerning for recurrent DVT -Off Eliquis due to GI bleeding  Dysphagia -patient with reported h/o dysphagia -She has reportedly been eating ice chips at the facility -Recommend continuing nutrition as tolerated -Concern for possible mild dehydration. Recommend adequate hydration when resuming nutrition  DM -Recent A1c was 5.5 -Diet controlled -No indication for testing/treating at this time  Hypothyroidism -Normal TSH on 8/19 -Continue Synthroid at current dose for now   Discharge Instructions   Allergies as of 09/18/2019      Reactions   Clarithromycin Itching, Rash   Codeine Itching   Other reaction(s): Unknown   Latex Hives   Other reaction(s): Other Blisters   Other    adhesive on tape      Medication List    STOP taking these medications   diltiazem 30 MG  tablet Commonly known as: CARDIZEM     TAKE these medications   ACIDOPHILUS LACTOBACILLUS PO Take 1 tablet by mouth 2 (two) times daily.   carvedilol 6.25 MG tablet Commonly known as: COREG 1 tablet (6.25 mg total) by Per J Tube route 2 (two) times daily with a meal.   cetirizine 10 MG tablet Commonly known as: ZYRTEC Take 10 mg by mouth daily.   chlorhexidine 0.12 % solution Commonly known as: PERIDEX Use as directed 15 mLs in the mouth or throat 2 (two) times daily.   Darbepoetin Alfa 25 MCG/0.42ML Sosy injection Commonly known as: ARANESP Inject 25 mcg into the skin every 7 (seven) days.   feeding supplement (PRO-STAT SUGAR FREE 64) Liqd Take 30 mLs by mouth every 12 (twelve) hours.   ferrous sulfate 325 (65 FE) MG tablet Take 325 mg by mouth daily with breakfast.   folic acid 1 MG tablet Commonly known as: FOLVITE Take 1 mg by mouth daily.   hydrOXYzine 25 MG tablet Commonly known as: ATARAX/VISTARIL Take 25 mg by mouth 3 (three) times daily as needed for itching.   levothyroxine 100 MCG tablet Commonly known as: SYNTHROID Take 100 mcg by mouth daily before  breakfast.   loperamide 2 MG tablet Commonly known as: IMODIUM A-D Take 2 mg by mouth daily.   Melatonin 3 MG Tabs Take 1 tablet by mouth at bedtime.   multivitamin with minerals Tabs tablet Take 1 tablet by mouth daily.   pantoprazole 40 MG tablet Commonly known as: PROTONIX Take 1 tablet (40 mg total) by mouth 2 (two) times daily. What changed: when to take this   phenol 1.4 % Liqd Commonly known as: CHLORASEPTIC Use as directed 1 spray in the mouth or throat every 12 (twelve) hours.   sertraline 100 MG tablet Commonly known as: ZOLOFT Take 100 mg by mouth daily.   Vitamin D3 50 MCG (2000 UT) Tabs Take 1 tablet by mouth daily.      Follow-up Information    Nona Dell, Corene Cornea, MD. Schedule an appointment as soon as possible for a visit in 1 week(s).   Specialty: Internal Medicine Contact  information: [address] [place] [address] [phone]          Allergies  Allergen Reactions  . Clarithromycin Itching and Rash  . Codeine Itching    Other reaction(s): Unknown  . Latex Hives    Other reaction(s): Other Blisters   . Other     adhesive on tape    Consultations:  Nephrology  Procedures/Studies: Ct Head Wo Contrast  Result Date: 09/17/2019 CLINICAL DATA:  Unexplained altered level of consciousness. EXAM: CT HEAD WITHOUT CONTRAST TECHNIQUE: Contiguous axial images were obtained from the base of the skull through the vertex without intravenous contrast. COMPARISON:  08/28/2017 FINDINGS: Brain: No evidence of acute infarction, hemorrhage, hydrocephalus, extra-axial collection or mass lesion/mass effect. 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 Mild cerebral cortical atrophy. Vascular: No hyperdense vessel or unexpected calcification. Skull: Normal. Negative for fracture or focal lesion. Sinuses/Orbits: No acute finding. Other: None IMPRESSION: No acute abnormality. 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 Mild cerebral cortical atrophy. Electronically Signed   By: Lorriane Shire M.D.   On: 09/17/2019 08:13   US Renal  Result Date: 09/17/2019 CLINICAL DATA:  A chronic renal disease EXAM: RENAL / URINARY TRACT ULTRASOUND COMPLETE COMPARISON:  None. FINDINGS: Right Kidney: Renal measurements: 9.7 x 4.9 x 5.9 cm. = volume: 147 mL. Mild increased echogenicity is noted. No mass lesion or hydronephrosis is noted. Left Kidney: Renal measurements: 9.9 x 5.1 x 5.1 cm. = volume: 135 mL. Echogenicity within normal limits. No mass or hydronephrosis visualized. Bladder: Appears normal for degree of bladder distention. IMPRESSION: Mild increased echogenicity within the right kidney. No obstructive changes are seen. Electronically Signed   By: Inez Catalina M.D.   On: 09/17/2019 12:25   Dg Chest Portable 1 View  Result Date: 09/17/2019 CLINICAL DATA:   Altered mental status EXAM: PORTABLE CHEST 1 VIEW COMPARISON:  08/15/2019 FINDINGS: Tracheostomy tube remains well seated; over distended cuff no longer visualized. Cardiomegaly and aortic tortuosity accentuated by rightward rotation. Stable aeration. There is no edema, consolidation, effusion, or pneumothorax. IMPRESSION: No evidence of acute disease. Cardiomegaly. Electronically Signed   By: Monte Fantasia M.D.   On: 09/17/2019 05:30     Subjective: Non-verbal  Discharge Exam: Vitals:   09/18/19 0900 09/18/19 1000  BP: 110/61 120/61  Pulse: (!) 53   Resp: (!) 21 13  Temp:    SpO2: 100%    Vitals:   09/18/19 0800 09/18/19 0826 09/18/19 0900 09/18/19 1000  BP: (!) 117/91  110/61 120/61  Pulse: 80  (!)  53   Resp: (!) 21  (!) 21 13  Temp:  98.3 F (36.8 C)    TempSrc:  Axillary    SpO2: 100%  100%   Weight:      Height:        General: Pt is alert, awake, not in acute distress Cardiovascular: RRR, S1/S2 +, no rubs, no gallops Respiratory: CTA bilaterally, no wheezing, no rhonchi, trach in place Abdominal: Soft, NT, ND, bowel sounds + Extremities: no edema, no cyanosis   The results of significant diagnostics from this hospitalization (including imaging, microbiology, ancillary and laboratory) are listed below for reference.     Microbiology: Recent Results (from the past 240 hour(s))  SARS CORONAVIRUS 2 (TAT 6-24 HRS) Nasopharyngeal Nasopharyngeal Swab     Status: None   Collection Time: 09/17/19  5:09 AM   Specimen: Nasopharyngeal Swab  Result Value Ref Range Status   SARS Coronavirus 2 NEGATIVE NEGATIVE Final    Comment: (NOTE) SARS-CoV-2 target nucleic acids are NOT DETECTED. The SARS-CoV-2 RNA is generally detectable in upper and lower respiratory specimens during the acute phase of infection. Negative results do not preclude SARS-CoV-2 infection, do not rule out co-infections with other pathogens, and should not be used as the sole basis for treatment or other  patient management decisions. Negative results must be combined with clinical observations, patient history, and epidemiological information. The expected result is Negative. Fact Sheet for Patients: SugarRoll.be Fact Sheet for Healthcare Providers: https://www.woods-mathews.com/ This test is not yet approved or cleared by the [place] FDA and  has been authorized for detection and/or diagnosis of SARS-CoV-2 by FDA under an Emergency Use Authorization (EUA). This EUA will remain  in effect (meaning this test can be used) for the duration of the COVID-19 declaration under Section 56 4(b)(1) of the Act, 21 U.S.C. section 360bbb-3(b)(1), unless the authorization is terminated or revoked sooner. Performed at [facility], [place] [address] [place], [place] [address]   MRSA PCR Screening     Status: Abnormal   Collection Time: 09/17/19 10:26 PM   Specimen: Nasal Mucosa; Nasopharyngeal  Result Value Ref Range Status   MRSA by PCR POSITIVE (A) NEGATIVE Final    Comment:        The GeneXpert MRSA Assay (FDA approved for NASAL specimens only), is one component of a comprehensive MRSA colonization surveillance program. It is not intended to diagnose MRSA infection nor to guide or monitor treatment for MRSA infections. RESULT CALLED TO, READ BACK BY AND VERIFIED WITH: D OWEN RN 10/5 20 2350 JDW Performed at [facility] [address], [place], [place] [address]      Labs: BNP (last 3 results) No results for input(s): BNP in the last 8760 hours. Basic Metabolic Panel: Recent Labs  Lab 09/17/19 0455 09/17/19 0653 09/18/19 0322  NA 130* 129* 133*  K 5.4* 4.4 4.5  CL 88*  --  97*  CO2 27  --  25  GLUCOSE 104*  --  97  BUN 117*  --  104*  CREATININE 3.65*  --  3.65*  CALCIUM 10.0  --  9.6   Liver Function Tests: Recent Labs  Lab 09/17/19 0455 09/18/19 0322  AST 47* 34  ALT 29 26  ALKPHOS 408* 343*  BILITOT 0.8  0.4  PROT 7.8 7.2  ALBUMIN 2.7* 2.5*   No results for input(s): LIPASE, AMYLASE in the last 168 hours. No results for input(s): AMMONIA in the last 168 hours. CBC: Recent  Labs  Lab 09/17/19 0455 09/17/19 0653 09/18/19 0322  WBC 10.8*  --  8.1  NEUTROABS 7.9*  --   --   HGB 9.9* 9.5* 8.9*  HCT 29.6* 28.0* 28.1*  MCV 84.1  --  85.2  PLT 219  --  284   Cardiac Enzymes: No results for input(s): CKTOTAL, CKMB, CKMBINDEX, TROPONINI in the last 168 hours. BNP: Invalid input(s): POCBNP CBG: Recent Labs  Lab 09/17/19 1544 09/17/19 1949 09/17/19 2355 09/18/19 0322 09/18/19 0830  GLUCAP 71 90 95 91 99   D-Dimer No results for input(s): DDIMER in the last 72 hours. Hgb A1c No results for input(s): HGBA1C in the last 72 hours. Lipid Profile No results for input(s): CHOL, HDL, LDLCALC, TRIG, CHOLHDL, LDLDIRECT in the last 72 hours. Thyroid function studies No results for input(s): TSH, T4TOTAL, T3FREE, THYROIDAB in the last 72 hours.  Invalid input(s): FREET3 Anemia work up Recent Labs    09/18/19 0322  FERRITIN 763*  TIBC 193*  IRON 36   Urinalysis    Component Value Date/Time   COLORURINE YELLOW 09/17/2019 [place] 09/17/2019 0517   LABSPEC 1.011 09/17/2019 0517   PHURINE 8.0 09/17/2019 [place] 09/17/2019 0517   HGBUR NEGATIVE 09/17/2019 [place] NEGATIVE 09/17/2019 [place] NEGATIVE 09/17/2019 0517   PROTEINUR NEGATIVE 09/17/2019 0517   NITRITE NEGATIVE 09/17/2019 0517   LEUKOCYTESUR NEGATIVE 09/17/2019 0517   Sepsis Labs Invalid input(s): PROCALCITONIN,  WBC,  LACTICIDVEN Microbiology Recent Results (from the past 240 hour(s))  SARS CORONAVIRUS 2 (TAT 6-24 HRS) Nasopharyngeal Nasopharyngeal Swab     Status: None   Collection Time: 09/17/19  5:09 AM   Specimen: Nasopharyngeal Swab  Result Value Ref Range Status   SARS Coronavirus 2 NEGATIVE NEGATIVE Final    Comment: (NOTE) SARS-CoV-2 target nucleic  acids are NOT DETECTED. The SARS-CoV-2 RNA is generally detectable in upper and lower respiratory specimens during the acute phase of infection. Negative results do not preclude SARS-CoV-2 infection, do not rule out co-infections with other pathogens, and should not be used as the sole basis for treatment or other patient management decisions. Negative results must be combined with clinical observations, patient history, and epidemiological information. The expected result is Negative. Fact Sheet for Patients: SugarRoll.be Fact Sheet for Healthcare Providers: https://www.woods-mathews.com/ This test is not yet approved or cleared by the [place] FDA and  has been authorized for detection and/or diagnosis of SARS-CoV-2 by FDA under an Emergency Use Authorization (EUA). This EUA will remain  in effect (meaning this test can be used) for the duration of the COVID-19 declaration under Section 56 4(b)(1) of the Act, 21 U.S.C. section 360bbb-3(b)(1), unless the authorization is terminated or revoked sooner. Performed at [facility], [place] [address] [place], [place] [address]   MRSA PCR Screening     Status: Abnormal   Collection Time: 09/17/19 10:26 PM   Specimen: Nasal Mucosa; Nasopharyngeal  Result Value Ref Range Status   MRSA by PCR POSITIVE (A) NEGATIVE Final    Comment:        The GeneXpert MRSA Assay (FDA approved for NASAL specimens only), is one component of a comprehensive MRSA colonization surveillance program. It is not intended to diagnose MRSA infection nor to guide or monitor treatment for MRSA infections. RESULT CALLED TO, READ BACK BY AND VERIFIED WITH: D OWEN RN 10/5 20 2350 JDW Performed at [facility] [address], [place], [place]  [id number]    Time spent: 30 min  SIGNED:   Marylu Lund, MD  [facility] 09/18/2019, 10:48 AM  If 7PM-7AM, please contact night-coverage

## 2019-09-18 NOTE — Care Management Obs Status (Signed)
[facility] NOTIFICATION   Patient Details  Name: Shirley Malone MRN: [id number] Date of Birth: November 15, 1941   Medicare Observation Status Notification Given:  Yes    Bartholomew Crews, RN 09/18/2019, 11:15 AM

## 2019-09-28 ENCOUNTER — Inpatient Hospital Stay (HOSPITAL_COMMUNITY)
Admission: EM | Admit: 2019-09-28 | Discharge: 2019-10-13 | DRG: 308 | Disposition: E | Payer: Medicare Other | Source: Skilled Nursing Facility | Attending: Pulmonary Disease | Admitting: Pulmonary Disease

## 2019-09-28 ENCOUNTER — Emergency Department (HOSPITAL_COMMUNITY): Payer: Medicare Other

## 2019-09-28 DIAGNOSIS — Z885 Allergy status to narcotic agent status: Secondary | ICD-10-CM

## 2019-09-28 DIAGNOSIS — I4891 Unspecified atrial fibrillation: Secondary | ICD-10-CM | POA: Diagnosis not present

## 2019-09-28 DIAGNOSIS — E1122 Type 2 diabetes mellitus with diabetic chronic kidney disease: Secondary | ICD-10-CM | POA: Diagnosis present

## 2019-09-28 DIAGNOSIS — Z515 Encounter for palliative care: Secondary | ICD-10-CM | POA: Diagnosis present

## 2019-09-28 DIAGNOSIS — Z79899 Other long term (current) drug therapy: Secondary | ICD-10-CM

## 2019-09-28 DIAGNOSIS — E039 Hypothyroidism, unspecified: Secondary | ICD-10-CM | POA: Diagnosis present

## 2019-09-28 DIAGNOSIS — Z930 Tracheostomy status: Secondary | ICD-10-CM

## 2019-09-28 DIAGNOSIS — Z888 Allergy status to other drugs, medicaments and biological substances status: Secondary | ICD-10-CM

## 2019-09-28 DIAGNOSIS — Z9911 Dependence on respirator [ventilator] status: Secondary | ICD-10-CM

## 2019-09-28 DIAGNOSIS — Z881 Allergy status to other antibiotic agents status: Secondary | ICD-10-CM

## 2019-09-28 DIAGNOSIS — E875 Hyperkalemia: Secondary | ICD-10-CM | POA: Diagnosis present

## 2019-09-28 DIAGNOSIS — I482 Chronic atrial fibrillation, unspecified: Secondary | ICD-10-CM | POA: Diagnosis present

## 2019-09-28 DIAGNOSIS — I447 Left bundle-branch block, unspecified: Secondary | ICD-10-CM | POA: Diagnosis not present

## 2019-09-28 DIAGNOSIS — D631 Anemia in chronic kidney disease: Secondary | ICD-10-CM | POA: Diagnosis present

## 2019-09-28 DIAGNOSIS — Z791 Long term (current) use of non-steroidal anti-inflammatories (NSAID): Secondary | ICD-10-CM

## 2019-09-28 DIAGNOSIS — E871 Hypo-osmolality and hyponatremia: Secondary | ICD-10-CM | POA: Diagnosis present

## 2019-09-28 DIAGNOSIS — I4901 Ventricular fibrillation: Secondary | ICD-10-CM | POA: Diagnosis not present

## 2019-09-28 DIAGNOSIS — R579 Shock, unspecified: Secondary | ICD-10-CM | POA: Diagnosis present

## 2019-09-28 DIAGNOSIS — N185 Chronic kidney disease, stage 5: Secondary | ICD-10-CM | POA: Diagnosis present

## 2019-09-28 DIAGNOSIS — Z931 Gastrostomy status: Secondary | ICD-10-CM

## 2019-09-28 DIAGNOSIS — J9620 Acute and chronic respiratory failure, unspecified whether with hypoxia or hypercapnia: Secondary | ICD-10-CM | POA: Diagnosis present

## 2019-09-28 DIAGNOSIS — Z66 Do not resuscitate: Secondary | ICD-10-CM | POA: Diagnosis present

## 2019-09-28 DIAGNOSIS — I469 Cardiac arrest, cause unspecified: Secondary | ICD-10-CM | POA: Diagnosis not present

## 2019-09-28 DIAGNOSIS — Z91040 Latex allergy status: Secondary | ICD-10-CM

## 2019-09-28 DIAGNOSIS — E662 Morbid (severe) obesity with alveolar hypoventilation: Secondary | ICD-10-CM | POA: Diagnosis present

## 2019-09-28 DIAGNOSIS — Z20828 Contact with and (suspected) exposure to other viral communicable diseases: Secondary | ICD-10-CM | POA: Diagnosis present

## 2019-09-28 DIAGNOSIS — G9340 Encephalopathy, unspecified: Secondary | ICD-10-CM | POA: Diagnosis present

## 2019-09-28 LAB — BASIC METABOLIC PANEL
Anion gap: 25 — ABNORMAL HIGH (ref 5–15)
BUN: 97 mg/dL — ABNORMAL HIGH (ref 8–23)
CO2: 8 mmol/L — ABNORMAL LOW (ref 22–32)
Calcium: 10.2 mg/dL (ref 8.9–10.3)
Chloride: 93 mmol/L — ABNORMAL LOW (ref 98–111)
Creatinine, Ser: 3.38 mg/dL — ABNORMAL HIGH (ref 0.44–1.00)
GFR calc Af Amer: 15 mL/min — ABNORMAL LOW (ref >60)
GFR calc non Af Amer: 13 mL/min — ABNORMAL LOW (ref >60)
Glucose, Bld: 139 mg/dL — ABNORMAL HIGH (ref 70–99)
Potassium: 6.5 mmol/L (ref 3.5–5.1)
Sodium: 126 mmol/L — ABNORMAL LOW (ref 135–145)

## 2019-09-28 LAB — CBC WITH DIFFERENTIAL/PLATELET
Abs Immature Granulocytes: 0.64 10*3/uL — ABNORMAL HIGH (ref 0.00–0.07)
Basophils Absolute: 0.1 10*3/uL (ref 0.0–0.1)
Basophils Relative: 0 %
Eosinophils Absolute: 0.4 10*3/uL (ref 0.0–0.5)
Eosinophils Relative: 2 %
HCT: 32 % — ABNORMAL LOW (ref 36.0–46.0)
Hemoglobin: 9.7 g/dL — ABNORMAL LOW (ref 12.0–15.0)
Immature Granulocytes: 3 %
Lymphocytes Relative: 22 %
Lymphs Abs: 5.3 10*3/uL — ABNORMAL HIGH (ref 0.7–4.0)
MCH: 28.3 pg (ref 26.0–34.0)
MCHC: 30.3 g/dL (ref 30.0–36.0)
MCV: 93.3 fL (ref 80.0–100.0)
Monocytes Absolute: 1.6 10*3/uL — ABNORMAL HIGH (ref 0.1–1.0)
Monocytes Relative: 6 %
Neutro Abs: 16.5 10*3/uL — ABNORMAL HIGH (ref 1.7–7.7)
Neutrophils Relative %: 67 %
Platelets: 220 10*3/uL (ref 150–400)
RBC: 3.43 MIL/uL — ABNORMAL LOW (ref 3.87–5.11)
RDW: 15.4 % (ref 11.5–15.5)
WBC: 24.5 10*3/uL — ABNORMAL HIGH (ref 4.0–10.5)
nRBC: 0 % (ref 0.0–0.2)

## 2019-09-28 LAB — I-STAT CHEM 8, ED
BUN: 104 mg/dL — ABNORMAL HIGH (ref 8–23)
Calcium, Ion: 1.18 mmol/L (ref 1.15–1.40)
Chloride: 101 mmol/L (ref 98–111)
Creatinine, Ser: 3.3 mg/dL — ABNORMAL HIGH (ref 0.44–1.00)
Glucose, Bld: 136 mg/dL — ABNORMAL HIGH (ref 70–99)
HCT: 31 % — ABNORMAL LOW (ref 36.0–46.0)
Hemoglobin: 10.5 g/dL — ABNORMAL LOW (ref 12.0–15.0)
Potassium: 6.2 mmol/L — ABNORMAL HIGH (ref 3.5–5.1)
Sodium: 123 mmol/L — ABNORMAL LOW (ref 135–145)
TCO2: 13 mmol/L — ABNORMAL LOW (ref 22–32)

## 2019-09-28 LAB — POCT I-STAT 7, (LYTES, BLD GAS, ICA,H+H)
Acid-base deficit: 16 mmol/L — ABNORMAL HIGH (ref 0.0–2.0)
Bicarbonate: 13.7 mmol/L — ABNORMAL LOW (ref 20.0–28.0)
Calcium, Ion: 1.35 mmol/L (ref 1.15–1.40)
HCT: 29 % — ABNORMAL LOW (ref 36.0–46.0)
Hemoglobin: 9.9 g/dL — ABNORMAL LOW (ref 12.0–15.0)
O2 Saturation: 100 %
Patient temperature: 96
Potassium: 6.9 mmol/L (ref 3.5–5.1)
Sodium: 121 mmol/L — ABNORMAL LOW (ref 135–145)
TCO2: 15 mmol/L — ABNORMAL LOW (ref 22–32)
pCO2 arterial: 44.4 mmHg (ref 32.0–48.0)
pH, Arterial: 7.087 — CL (ref 7.350–7.450)
pO2, Arterial: 427 mmHg — ABNORMAL HIGH (ref 83.0–108.0)

## 2019-09-28 LAB — MAGNESIUM: Magnesium: 3 mg/dL — ABNORMAL HIGH (ref 1.7–2.4)

## 2019-09-28 LAB — TROPONIN I (HIGH SENSITIVITY): Troponin I (High Sensitivity): 919 ng/L (ref <18)

## 2019-09-28 MED ORDER — CALCIUM GLUCONATE-NACL 1-0.675 GM/50ML-% IV SOLN
1.0000 g | Freq: Once | INTRAVENOUS | Status: AC
  Administered 2019-09-28: 1000 mg via INTRAVENOUS
  Filled 2019-09-28: qty 50

## 2019-09-28 MED ORDER — SODIUM BICARBONATE 8.4 % IV SOLN
50.0000 meq | Freq: Once | INTRAVENOUS | Status: AC
  Administered 2019-09-28: 50 meq via INTRAVENOUS

## 2019-09-28 MED ORDER — MORPHINE SULFATE (PF) 4 MG/ML IV SOLN
INTRAVENOUS | Status: AC
  Administered 2019-09-28: 4 mg
  Filled 2019-09-28: qty 1

## 2019-09-28 MED ORDER — SODIUM CHLORIDE 0.9 % IV SOLN: 1.0000 g | Freq: Once | INTRAVENOUS | Status: DC

## 2019-09-28 MED ORDER — GLYCOPYRROLATE 1 MG PO TABS
1.0000 mg | ORAL_TABLET | ORAL | Status: DC | PRN
  Filled 2019-09-28: qty 1

## 2019-09-28 MED ORDER — INSULIN ASPART 100 UNIT/ML IV SOLN
5.0000 [IU] | Freq: Once | INTRAVENOUS | Status: AC
  Administered 2019-09-28: 5 [IU] via INTRAVENOUS

## 2019-09-28 MED ORDER — SODIUM CHLORIDE 0.9 % IV SOLN
1.0000 mg/h | INTRAVENOUS | Status: DC
  Filled 2019-09-28: qty 2.5

## 2019-09-28 MED ORDER — GLYCOPYRROLATE 0.2 MG/ML IJ SOLN: 0.2000 mg | INTRAMUSCULAR | Status: DC | PRN

## 2019-09-28 MED ORDER — EPINEPHRINE HCL 5 MG/250ML IV SOLN IN NS
0.5000 ug/min | INTRAVENOUS | Status: DC
  Administered 2019-09-28: 5 ug/min via INTRAVENOUS

## 2019-09-28 MED ORDER — DEXTROSE 50 % IV SOLN
1.0000 | Freq: Once | INTRAVENOUS | Status: AC
  Administered 2019-09-28: 50 mL via INTRAVENOUS
  Filled 2019-09-28: qty 50

## 2019-09-28 MED ORDER — MORPHINE 100MG IN NS 100ML (1MG/ML) PREMIX INFUSION
0.0000 mg/h | INTRAVENOUS | Status: DC
  Administered 2019-09-28: 5 mg/h via INTRAVENOUS
  Filled 2019-09-28: qty 100

## 2019-09-28 MED ORDER — MORPHINE BOLUS VIA INFUSION
5.0000 mg | INTRAVENOUS | Status: DC | PRN
  Filled 2019-09-28: qty 5

## 2019-09-28 MED ORDER — HYDROMORPHONE HCL 1 MG/ML IJ SOLN
2.0000 mg | Freq: Once | INTRAMUSCULAR | Status: AC
  Administered 2019-09-28: 2 mg via INTRAVENOUS

## 2019-09-28 MED ORDER — EPINEPHRINE 1 MG/10ML IJ SOSY
PREFILLED_SYRINGE | INTRAMUSCULAR | Status: AC | PRN
  Administered 2019-09-28: 1 via INTRAVENOUS

## 2019-09-28 MED ORDER — DIPHENHYDRAMINE HCL 50 MG/ML IJ SOLN
INTRAMUSCULAR | Status: AC
  Filled 2019-09-28: qty 1

## 2019-09-28 MED ORDER — POLYVINYL ALCOHOL 1.4 % OP SOLN: 1.0000 [drp] | Freq: Four times a day (QID) | OPHTHALMIC | Status: DC | PRN

## 2019-09-28 MED ORDER — DIPHENHYDRAMINE HCL 50 MG/ML IJ SOLN
25.0000 mg | INTRAMUSCULAR | Status: DC | PRN
  Administered 2019-09-28: 25 mg via INTRAVENOUS

## 2019-09-28 MED ORDER — MORPHINE SULFATE (PF) 2 MG/ML IV SOLN: 2.0000 mg | INTRAVENOUS | Status: DC | PRN

## 2019-09-28 MED ORDER — HYDROMORPHONE HCL 1 MG/ML IJ SOLN
INTRAMUSCULAR | Status: AC
  Filled 2019-09-28: qty 2

## 2019-09-28 NOTE — ED Provider Notes (Signed)
[facility] EMERGENCY DEPARTMENT Provider Note   CSN: [id number] Arrival date & time: 10/08/2019  1901     History   Chief Complaint Chief Complaint  Patient presents with  . Code STEMI  . post CPR    HPI Shirley Malone is a 78 y.o. female.     HPI  The patient is a 78 year old female who presented to the ED after a cardiac arrest. She was resided at her SNF at [facility] and per EMS was found unresponsive with a last known normal at 5:30pm. The patient reportedly had a DNR but family overrode it on scene. The facility staff started CPR and gave 5 total rounds of epi. EMS arrived and found her in ventricular fibrillation. EMS gave a total of 6 epinephrine and Amiodarone 450mg . EMS achieved ROSC. 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 Additionally the patient was in torsades and received calcium and 2g  Mg en route. She was subsequently placed on an Epi gtt. On arrival, the patient was unresponsive, GCS3. Pupils were fixed and dilated. The patient was placed on a Epi gtt at 5 on arrival and subsequently experienced a PEA arrest. She received 2 minutes of compression before ROSC was again achieved. I spoke with the patient's two sons over the phone who were driving from [place] and were about 1 hour away. They agreed over the phone that her wishes would be for DNR. Patient's CODE STATUS was changed to DNR.   Past Medical History:  Diagnosis Date  . Anemia   . Atrial fibrillation ([place])   . Atrial fibrillation, chronic ([place]) 09/17/2019  . Chronic kidney disease   . Chronic respiratory failure ([place])   . Diabetes mellitus without complication ([place])   . DVT (deep venous thrombosis) ([place])   . Gastrointestinal bleed   . Morbid obesity ([place] )   . 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 Thyroid disease     Patient Active Problem List   Diagnosis Date Noted  . Cardiac arrest ([place]) 10/01/2019  . Encephalopathy, metabolic  [id number]  . Chronic kidney disease (CKD), active medical management without dialysis, stage 5 ([place]) 09/17/2019  . Chronic respiratory failure with hypoxia ([place]) 09/17/2019  . Dysphagia 09/17/2019  . Type 2 diabetes mellitus without complication ([place]) [id number]  . Hypothyroidism (acquired) 09/17/2019  . DVT (deep venous thrombosis) ([place]) 09/17/2019  . Atrial fibrillation, chronic ([place]) 09/17/2019  . Hiccoughs 09/17/2019  . Altered mental status 09/17/2019  . Pressure injury of skin 08/17/2019  . AKI (acute kidney injury) ([place]) 08/16/2019  . Hypotension 08/15/2019  . ARF (acute renal failure) ([place]) 08/15/2019  . Hyperkalemia 08/15/2019  . GI bleed 08/01/2019  . Symptomatic anemia   . Anticoagulated   . Acute blood loss anemia 07/30/2019  . Respiratory failure [facility])     Past Surgical History:  Procedure Laterality Date  . PEG TUBE PLACEMENT    . TRACHEOSTOMY       OB History   No obstetric history on file.      Home Medications    Prior to Admission medications   Medication Sig Start Date End Date Taking? Authorizing Provider  acetaminophen (TYLENOL) 325 MG tablet Place 650 mg into feeding tube every 8 (eight) hours as needed for mild pain, fever or headache.   Yes [provider]  ACIDOPHILUS LACTOBACILLUS PO Place 1 tablet into feeding tube every 12 (twelve) hours.    Yes [provider]  busPIRone (BUSPAR) 5 MG tablet Place 5 mg into feeding tube every 12 (twelve) hours.   Yes [provider]  carvedilol (COREG) 6.25 MG tablet 1 tablet (6.25 mg total) by Per J Tube route 2 (two) times daily with a meal. Patient taking differently: Place 6.25 mg into feeding tube 2 (two) times daily with a meal.  08/19/19  Yes Adhikari, Amrit, MD  cetirizine (ZYRTEC) 10 MG tablet Place 10 mg into feeding tube daily.    Yes [provider]  chlorhexidine (PERIDEX) 0.12 % solution Use as directed 15 mLs in the mouth or throat 2 (two) times daily.   Yes  [provider]  Cholecalciferol (VITAMIN D3) 50 MCG (2000 UT) TABS Place 2,000 Units into feeding tube daily.    Yes [provider]  Darbepoetin Alfa (ARANESP) 25 MCG/0.42ML SOSY injection Inject 25 mcg into the skin every 7 (seven) days.   Yes [provider]  ferrous sulfate 325 (65 FE) MG tablet 325 mg See admin instructions. 325 mg per tube in the morning   Yes [provider]  folic acid (FOLVITE) 1 MG tablet Place 1 mg into feeding tube daily.    Yes [provider]  hydrOXYzine (ATARAX/VISTARIL) 25 MG tablet Place 25 mg into feeding tube every 8 (eight) hours as needed for itching.    Yes [provider]  ibuprofen (ADVIL) 600 MG tablet Place 600 mg into feeding tube every 12 (twelve) hours as needed (for right shoulder pain).    Yes [provider]  ipratropium-albuterol (DUONEB) 0.5-2.5 (3) MG/3ML SOLN Take 3 mLs by nebulization every 6 (six) hours as needed ("for acute and chronic respiratory failure with hypercapnia").   Yes [provider]  levothyroxine (SYNTHROID) 100 MCG tablet Place 100 mcg into feeding tube daily before breakfast.    Yes [provider]  loperamide (IMODIUM A-D) 2 MG tablet Place 2 mg into feeding tube daily.    Yes [provider]  LORazepam (ATIVAN) 0.5 MG tablet Place 0.5 mg into feeding tube daily as needed for anxiety.   Yes [provider]  Melatonin 3 MG TABS Place 3 mg into feeding tube at bedtime.    Yes [provider]  Multiple Vitamin (MULTIVITAMIN WITH MINERALS) TABS tablet Place 1 tablet into feeding tube daily.    Yes [provider]  nitroGLYCERIN (NITROSTAT) 0.4 MG SL tablet Place 0.4 mg under the tongue every 5 (five) minutes as needed for chest pain. 01/17/19  Yes [provider]  Nutritional Supplements (DIABETISOURCE) LIQD Place 60 mL/hr into feeding tube continuous.   Yes [provider]  pantoprazole (PROTONIX)  40 MG tablet Take 1 tablet (40 mg total) by mouth 2 (two) times daily. Patient taking differently: 40 mg See admin instructions. 40 mg per tube two times a day 08/01/19  Yes Adhikari, Tamsen Meek, MD  sertraline (ZOLOFT) 100 MG tablet Place 100 mg into feeding tube daily.    Yes [provider]  Water For Irrigation, Sterile (FREE WATER) SOLN Place 250 mLs into feeding tube every 4 (four) hours.   Yes [provider]  phenol (CHLORASEPTIC) 1.4 % LIQD Use as directed 1 spray in the mouth or throat every 12 (twelve) hours.    [provider]    Family History No family history on file.  Social History Social History   Tobacco Use  . Smoking status: Never Smoker  . Smokeless tobacco: Never Used  Substance Use Topics  .  Alcohol use: Not Currently  . Drug use: Not Currently     Allergies   Clarithromycin, Codeine, Latex, and Adhesive [tape]   Review of Systems Review of Systems  Unable to perform ROS: Acuity of condition     Physical Exam Updated Vital Signs BP 113/88   Pulse 60   Temp (!) 96 F (35.6 C) (Tympanic)   Resp (!) 9   Ht 5\' 2"  (1.575 m)   Wt 81.4 kg   BMI 32.82 kg/m   Physical Exam Vitals signs and nursing note reviewed.  Constitutional:      Appearance: She is well-developed. She is ill-appearing and toxic-appearing.  HENT:     Head: Normocephalic and atraumatic.  Eyes:     Comments: Pupils 37mm and nonreactive  Neck:     Musculoskeletal: Neck supple.     Comments: Tracheostomy in placed, BVM attached, actively being bagged Cardiovascular:     Rate and Rhythm: Bradycardia present.     Comments: Bradycardic with pauses present. Thready carotid pulse present on arrival Pulmonary:     Comments: Bilateral breath sounds, course while being bagged Abdominal:     Palpations: Abdomen is soft.  Skin:    General: Skin is warm and dry.     Capillary Refill: Capillary refill takes more than 3 seconds.  Neurological:     Comments:  Completely unresponsive, GCS 3. No response to painful stimuli. No gag reflex. No corneals.       ED Treatments / Results  Labs (all labs ordered are listed, but only abnormal results are displayed) Labs Reviewed  CBC WITH DIFFERENTIAL/PLATELET - Abnormal; Notable for the following components:      Result Value   WBC 24.5 (*)    RBC 3.43 (*)    Hemoglobin 9.7 (*)    HCT 32.0 (*)    Neutro Abs 16.5 (*)    Lymphs Abs 5.3 (*)    Monocytes Absolute 1.6 (*)    Abs Immature Granulocytes 0.64 (*)    All other components within normal limits  MAGNESIUM - Abnormal; Notable for the following components:   Magnesium 3.0 (*)    All other components within normal limits  BASIC METABOLIC PANEL - Abnormal; Notable for the following components:   Sodium 126 (*)    Potassium 6.5 (*)    Chloride 93 (*)    CO2 8 (*)    Glucose, Bld 139 (*)    BUN 97 (*)    Creatinine, Ser 3.38 (*)    GFR calc non Af Amer 13 (*)    GFR calc Af Amer 15 (*)    Anion gap 25 (*)    All other components within normal limits  I-STAT CHEM 8, ED - Abnormal; Notable for the following components:   Sodium 123 (*)    Potassium 6.2 (*)    BUN 104 (*)    Creatinine, Ser 3.30 (*)    Glucose, Bld 136 (*)    TCO2 13 (*)    Hemoglobin 10.5 (*)    HCT 31.0 (*)    All other components within normal limits  POCT I-STAT 7, (LYTES, BLD GAS, ICA,H+H) - Abnormal; Notable for the following components:   pH, Arterial 7.087 (*)    pO2, Arterial 427.0 (*)    Bicarbonate 13.7 (*)    TCO2 15 (*)    Acid-base deficit 16.0 (*)    Sodium 121 (*)    Potassium 6.9 (*)    HCT 29.0 (*)  Hemoglobin 9.9 (*)    All other components within normal limits  TROPONIN I (HIGH SENSITIVITY) - Abnormal; Notable for the following components:   Troponin I (High Sensitivity) 919 (*)    All other components within normal limits  SARS CORONAVIRUS 2 BY RT PCR [facility] ORDER, [place] [facility])    EKG EKG Interpretation   Date/Time:  Saturday September 28 2019 19:06:21 EDT Ventricular Rate:  87 PR Interval:    QRS Duration: 165 QT Interval:  441 QTC Calculation: 531 R Axis:   -58 Text Interpretation:  Atrial fibrillation Nonspecific IVCD with LAD LVH with secondary repolarization abnormality Inferior infarct, old Anterior infarct, old changed from prior EKG Confirmed by Malvin Johns [phone]) on 09/27/2019 8:33:19 PM   Radiology Dg Chest Portable 1 View  Result Date: 10/03/2019 CLINICAL DATA:  Code STEMI.  Post CPR.  Patient found unresponsive. EXAM: PORTABLE CHEST 1 VIEW COMPARISON:  September 17, 2019 FINDINGS: The tracheostomy tube terminates in good position. A right PICC line projects over the peripheral SVC. There is a loop within the PICC line just superior to the clavicle. Transcutaneous pacer leads project on the left. Cardiomegaly is stable. A tortuous thoracic aorta and mediastinum are stable. No pneumothorax. No focal infiltrates or pulmonary edema identified. IMPRESSION: 1. There is a a loop within the right PICC line projected just superior to the medial clavicle. Consider repositioning. The distal tip is in the peripheral SVC. 2. Cardiomegaly and tortuous thoracic aorta. 3. No edema.  No pneumothorax. Electronically Signed   By: Dorise Bullion III M.D   On: 10/10/2019 19:28    Procedures Procedures (including critical care time)  Medications Ordered in ED Medications  EPINEPHrine (ADRENALIN) 1 MG/10ML injection (1 Syringe Intravenous Given 09/21/2019 1917)  insulin aspart (novoLOG) injection 5 Units (5 Units Intravenous Given 09/23/2019 2004)    And  dextrose 50 % solution 50 mL (50 mLs Intravenous Given 09/22/2019 2005)  sodium bicarbonate injection 50 mEq (50 mEq Intravenous Given 09/19/2019 2006)  calcium gluconate 1 g/ 50 mL sodium chloride IVPB (0 g Intravenous Stopped 09/13/2019 2032)  HYDROmorphone (DILAUDID) injection 2 mg (2 mg Intravenous Given 09/16/2019 2223)  morphine 4 MG/ML injection (4 mg   Given 09/28/19 2333)   (has no administration in time range)     Initial Impression / Assessment and Plan / ED Course  I have reviewed the triage vital signs and the nursing notes.  Pertinent labs & imaging results that were available during my care of the patient were reviewed by me and considered in my medical decision making (see chart for details).  Clinical Course as of Sep 29 1231  Sat Sep 28, 2019  1941 pH, Arterial(!!): 7.087 [JL]  1941 Potassium(!!): 6.9 [JL]  2049 Troponin I (High Sensitivity)(!!): 919 [JL]  2049 Sodium(!): 123 [JL]  2050 Creatinine(!): 3.30 [JL]  2050 WBC(!): 24.5 [JL]  2050 Hemoglobin(!): 9.7 [JL]  2050 Magnesium(!): 3.0 [JL]    Clinical Course User Index [JL] Regan Lemming, MD       The patient is a 78 year old female who presents following cardiac arrest s/p around 30 minutes total of CPR with multiple rounds of epinephrine. She was in vfib and torsades and was given calcium and magnesium by EMS. She experienced a PEA arrest on arrival and achieved ROSC after two minutes of CPR. She was made a DNR by family over the phone. She was bradycardic and hypotensive on arrival and was placed on a Epinephrine gtt  at 68mcg/min, subsequently increased to 15. She remained hypotensive.  Labs concerning for a metabolic acidosis and multisystem organ failure, pH 7.087, K 6.9, trop 919, Na 123, Cr 3.3.   Given her hyperkalemia, temporizing measures were ordered with Insulin/dextrose and calcium gluconate. 80mEq bicarbonate administered. A Head CT was ordered. Critical care consulted for admission.  Family arrived and after a long discussion regarding goals of care, the decision was made to proceed to comfort measures. I explained to family that the patient was actively dying. The Epi gtt was weaned off and the patient was left on mechanical ventilation at the request of family. She ultimately expired on 10-01-2019 at 1207 AM. Family was present bedside and a death  certificate was signed.  Final Clinical Impressions(s) / ED Diagnoses   Final diagnoses:  Cardiac arrest [facility])    ED Discharge Orders    None       Regan Lemming, MD 10-01-19 1232    Malvin Johns, MD 2019-10-01 1418    Malvin Johns, MD 10/18/19 1044

## 2019-09-28 NOTE — ED Notes (Signed)
Paged Chaplain

## 2019-09-28 NOTE — ED Notes (Signed)
Provider aware of bp, verbally ordered epi to 70mc/min.  Done

## 2019-09-28 NOTE — ED Notes (Addendum)
Family bedside - son and Kambre Silva - son [phone] [address], [place] [address]  Jackelyn Poling [phone]

## 2019-09-28 NOTE — ED Notes (Signed)
RN sitting w/ pt

## 2019-09-28 NOTE — ED Notes (Signed)
Provider talking to family on phone in room

## 2019-09-28 NOTE — ED Notes (Signed)
Provider, chaplin and RN spoke w/ sons who just arrived.  Family bedside

## 2019-09-28 NOTE — ED Triage Notes (Signed)
Pt from [facility] per EMS, found unresponsive, no pulse, last known well 5.30.    Pt was an DNR however family overrode it.  Facility started CPR, gave a total of 5 epi, EMS found her in vfib.  EMS gave a total of 6 eip, amero 450.  Had ROS, was in torsods, gave 2 mag and calcium, placed on epi drip.

## 2019-09-28 NOTE — ED Notes (Signed)
RN noted soiled soft restraint on pt's right wrist.  This was on when pt arrived from [facility]

## 2019-09-28 NOTE — ED Notes (Signed)
Provider bedside, family continues to be bedside

## 2019-09-28 NOTE — ED Notes (Signed)
Per ICU provider, verbal order to give 4mg  bolus of morphine was given.

## 2019-09-28 NOTE — ED Notes (Signed)
ICU provider bedside

## 2019-09-28 NOTE — ED Notes (Signed)
Family back to consultation room w/ chaplin

## 2019-09-28 NOTE — Consult Note (Signed)
Cardiology Consultation:   Patient ID: Shirley Malone MRN: [id number]; DOB: Oct 24, 1941  Admit date: 09/24/2019 Date of Consult: 10/02/2019  Primary Care Provider: Townsend Roger, MD Primary Cardiologist: No primary care provider on file.   Primary Electrophysiologist:  None    Patient Profile:   Shirley Malone is a 78 y.o. female who is reportedly non-verbal at baseline with a hx of chronic respiratory failure s/p trach, s/p PEG tube, AF, hx GI bleeding, anemia, CKD, obesity, hypothyroidism, DM2 who had a cardiac arrest in her SNF is being seen today for the evaluation of ECG changes post-arrest at the request of Dr. Tamera Punt.  History of Present Illness:   Shirley Malone is a 78 y.o. female who is reportedly non-verbal at baseline with a hx of chronic respiratory failure s/p trach, s/p PEG tube, AF, hx GI bleeding, anemia, CKD, obesity, hypothyroidism, DM2 who had a cardiac arrest in her SNF is being seen today for the evaluation of ECG changes-post arrest.  The patient is chronically ill and is reportedly non-verbal and minimally interactive at baseline with trach and PEG in place. She was had multiple recent hospitalizations for causes including GIB/anemia and metabolic encephalopathy.   She was brought to the ED as CODE STEMI after being found unresponsive and pulseless at [facility]. Per EMS sign out, the patient had >30 minutes of ACLS with epi x 15 and amiodarone. Her rhythm was reportedly polymorphic VT for which she had multiple defibrillations. ROSC was achieved, and she subsequently had VT that was defibrillated. ECG in the field showed previously undiagnosed LBBB without meeting Sgarbossa criteria for STEMI. She was started on epi gtt. Reportedly, per EMS, pt was DNR, although this order was reversed by the family today when the pt was found pulseless.   On my assessment in the ED, pt is non-verbal. 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 ABG showed 7.09/44.4/427.  Labs included Na 123, K 6.2 -> 6.9, Cr 3.3. Hgb 9.9. Case was discussed with Dr. Martinique, and CODE STEMI was cancelled. She subsequently had another PEA arrest in the ED with achievement of ROSC. Her family was contacted by the ED team, and she was made DNR again.   Heart Pathway Score:     Past Medical History:  Diagnosis Date   Anemia    Atrial fibrillation (HCC)    Atrial fibrillation, chronic ([place]) 09/17/2019   Chronic kidney disease    Chronic respiratory failure (HCC)    Diabetes mellitus without complication ([place])    DVT (deep venous thrombosis) (HCC)    Gastrointestinal bleed    Morbid obesity (HCC)    Obesity hypoventilation syndrome (HCC)    Trilogy vent at night   Thyroid disease     Past Surgical History:  Procedure Laterality Date   PEG TUBE PLACEMENT     TRACHEOSTOMY       Home Medications:  Prior to Admission medications   Medication Sig Start Date End Date Taking? Authorizing Provider  acetaminophen (TYLENOL) 325 MG tablet Place 650 mg into feeding tube every 8 (eight) hours as needed for mild pain, fever or headache.   Yes [provider]  ACIDOPHILUS LACTOBACILLUS PO Place 1 tablet into feeding tube every 12 (twelve) hours.    Yes [provider]  busPIRone (BUSPAR) 5 MG tablet Place 5 mg into feeding tube every 12 (twelve) hours.   Yes [provider]  carvedilol (COREG) 6.25 MG tablet 1 tablet (6.25 mg total) by Per J Tube route  2 (two) times daily with a meal. Patient taking differently: Place 6.25 mg into feeding tube 2 (two) times daily with a meal.  08/19/19  Yes Adhikari, Amrit, MD  cetirizine (ZYRTEC) 10 MG tablet Place 10 mg into feeding tube daily.    Yes [provider]  chlorhexidine (PERIDEX) 0.12 % solution Use as directed 15 mLs in the mouth or throat 2 (two) times daily.   Yes [provider]  Cholecalciferol (VITAMIN D3) 50 MCG (2000 UT) TABS Place 2,000 Units into feeding tube daily.    Yes  [provider]  Darbepoetin Alfa (ARANESP) 25 MCG/0.42ML SOSY injection Inject 25 mcg into the skin every 7 (seven) days.   Yes [provider]  ferrous sulfate 325 (65 FE) MG tablet 325 mg See admin instructions. 325 mg per tube in the morning   Yes [provider]  folic acid (FOLVITE) 1 MG tablet Place 1 mg into feeding tube daily.    Yes [provider]  hydrOXYzine (ATARAX/VISTARIL) 25 MG tablet Place 25 mg into feeding tube every 8 (eight) hours as needed for itching.    Yes [provider]  ibuprofen (ADVIL) 600 MG tablet Place 600 mg into feeding tube every 12 (twelve) hours as needed (for right shoulder pain).    Yes [provider]  ipratropium-albuterol (DUONEB) 0.5-2.5 (3) MG/3ML SOLN Take 3 mLs by nebulization every 6 (six) hours as needed ("for acute and chronic respiratory failure with hypercapnia").   Yes [provider]  levothyroxine (SYNTHROID) 100 MCG tablet Place 100 mcg into feeding tube daily before breakfast.    Yes [provider]  loperamide (IMODIUM A-D) 2 MG tablet Place 2 mg into feeding tube daily.    Yes [provider]  LORazepam (ATIVAN) 0.5 MG tablet Place 0.5 mg into feeding tube daily as needed for anxiety.   Yes [provider]  Melatonin 3 MG TABS Place 3 mg into feeding tube at bedtime.    Yes [provider]  Multiple Vitamin (MULTIVITAMIN WITH MINERALS) TABS tablet Place 1 tablet into feeding tube daily.    Yes [provider]  nitroGLYCERIN (NITROSTAT) 0.4 MG SL tablet Place 0.4 mg under the tongue every 5 (five) minutes as needed for chest pain. 01/17/19  Yes [provider]  Nutritional Supplements (DIABETISOURCE) LIQD Place 60 mL/hr into feeding tube continuous.   Yes [provider]  pantoprazole (PROTONIX) 40 MG tablet Take 1 tablet (40 mg total) by mouth 2 (two) times daily. Patient taking differently: 40 mg See admin instructions.  40 mg per tube two times a day 08/01/19  Yes Adhikari, Tamsen Meek, MD  sertraline (ZOLOFT) 100 MG tablet Place 100 mg into feeding tube daily.    Yes [provider]  Water For Irrigation, Sterile (FREE WATER) SOLN Place 250 mLs into feeding tube every 4 (four) hours.   Yes [provider]  phenol (CHLORASEPTIC) 1.4 % LIQD Use as directed 1 spray in the mouth or throat every 12 (twelve) hours.    [provider]    Inpatient Medications: Scheduled Meds:  Continuous Infusions:  calcium gluconate     epinephrine 10 mcg/min (09/14/2019 1930)   PRN Meds:   Allergies:    Allergies  Allergen Reactions   Clarithromycin Itching and Rash   Codeine Itching   Latex Hives and Other (See Comments)    Blisters, also    Adhesive [Tape] Other (See Comments)    "Allergic," per [facility]  Social History:   Social History   Socioeconomic History   Marital status: Widowed    Spouse name: Not on file   Number of children: Not on file   Years of education: Not on file   Highest education level: Not on file  Occupational History   Not on file  Social Needs   Financial resource strain: Not on file   Food insecurity    Worry: Not on file    Inability: Not on file   Transportation needs    Medical: Not on file    Non-medical: Not on file  Tobacco Use   Smoking status: Never Smoker   Smokeless tobacco: Never Used  Substance and Sexual Activity   Alcohol use: Not Currently   Drug use: Not Currently   Sexual activity: Not on file  Lifestyle   Physical activity    Days per week: Not on file    Minutes per session: Not on file   Stress: Not on file  Relationships   Social connections    Talks on phone: Not on file    Gets together: Not on file    Attends religious service: Not on file    Active member of club or organization: Not on file    Attends meetings of clubs or organizations: Not on file    Relationship status: Not on file   Intimate  partner violence    Fear of current or ex partner: Not on file    Emotionally abused: Not on file    Physically abused: Not on file    Forced sexual activity: Not on file  Other Topics Concern   Not on file  Social History Narrative   Not on file    Family History:   No family history on file. Unable to obtain as pt nonverbal   ROS:  Unable to obtain as pt nonverbal   Physical Exam/Data:   Vitals:   09/15/2019 1925 09/21/2019 1930 09/13/2019 1930 09/20/2019 1930  BP: 100/63 (!) 79/45    Pulse:      Resp: 19 18    Temp:   (!) 96 F (35.6 C)   TempSrc:   Tympanic   Weight:    79.4 kg  Height:    5\' 2"  (1.575 m)   No intake or output data in the 24 hours ending 09/27/2019 2007 Last 3 Weights 09/22/2019 09/18/2019 08/20/2019  Weight (lbs) 175 lb 179 lb 7.3 oz 191 lb 2.2 oz  Weight (kg) 79.379 kg 81.4 kg 86.7 kg     Body mass index is 32.01 kg/m.  General:  Chronically ill.  HEENT: s/p tracheostomy Neck: s/p tracheostomy Cardiac:  Faint, irregular pulse.  Lungs:  Distant breath sounds though bag ventilation of tracheostomy Abd: soft, nontender, PEG tube in place Ext: no edema Musculoskeletal:  No deformities, BUE and BLE strength normal and equal Skin: warm and dry  Neuro:  Non-verbal, non-responsive Psych:  Non-verbal, non-responsive  EKG:  The EKG was personally reviewed and demonstrates:  AF with LBBB-type IVCD, although V1 with very small QS and < 1 mm ST elevation. Also with non-physiologic R wave progression- suspect lead misplacement Telemetry:  Not currently on telemetry   Relevant CV Studies: Echocardiogram 08/2019:  1. The left ventricle has normal systolic function, with an ejection fraction of 55-60%. The cavity size was normal. There is mildly increased left ventricular wall thickness. Left ventricular diastolic function could not be evaluated secondary to  atrial fibrillation.  2. The right  ventricle has low normal systolic function. The cavity was mildly enlarged.  There is no increase in right ventricular wall thickness.  3. Left atrial size was mildly dilated.  4. Right atrial size was moderately dilated.  5. The mitral valve is abnormal. Mild thickening of the mitral valve leaflet. Mitral valve regurgitation is moderate by color flow Doppler. The MR jet is posteriorly-directed.  6. The tricuspid valve is grossly normal.  7. The aortic valve is tricuspid. Mild sclerosis of the aortic valve. No stenosis of the aortic valve.  8. The aorta is normal unless otherwise noted.  9. The inferior vena cava was dilated in size with <50% respiratory variability. 10. The interatrial septum was not well visualized.  Laboratory Data:  High Sensitivity Troponin:  No results for input(s): TROPONINIHS in the last 720 hours.   Chemistry Recent Labs  Lab 09/16/2019 1927 09/21/2019 1935  NA 123* 121*  K 6.2* 6.9*  CL 101  --   GLUCOSE 136*  --   BUN 104*  --   CREATININE 3.30*  --     No results for input(s): PROT, ALBUMIN, AST, ALT, ALKPHOS, BILITOT in the last 168 hours. Hematology Recent Labs  Lab 10/03/2019 1925 10/09/2019 1927 09/16/2019 1935  WBC 24.5*  --   --   RBC 3.43*  --   --   HGB 9.7* 10.5* 9.9*  HCT 32.0* 31.0* 29.0*  MCV 93.3  --   --   MCH 28.3  --   --   MCHC 30.3  --   --   RDW 15.4  --   --   PLT 220  --   --    BNPNo results for input(s): BNP, PROBNP in the last 168 hours.  DDimer No results for input(s): DDIMER in the last 168 hours.   Radiology/Studies:  Dg Chest Portable 1 View  Result Date: 09/22/2019 CLINICAL DATA:  Code STEMI.  Post CPR.  Patient found unresponsive. EXAM: PORTABLE CHEST 1 VIEW COMPARISON:  September 17, 2019 FINDINGS: The tracheostomy tube terminates in good position. A right PICC line projects over the peripheral SVC. There is a loop within the PICC line just superior to the clavicle. Transcutaneous pacer leads project on the left. Cardiomegaly is stable. A tortuous thoracic aorta and mediastinum are stable. No  pneumothorax. No focal infiltrates or pulmonary edema identified. IMPRESSION: 1. There is a a loop within the right PICC line projected just superior to the medial clavicle. Consider repositioning. The distal tip is in the peripheral SVC. 2. Cardiomegaly and tortuous thoracic aorta. 3. No edema.  No pneumothorax. Electronically Signed   By: Dorise Bullion III M.D   On: 09/30/2019 19:28    Assessment and Plan:   S/P Cardiac Arrest LBBB The patient is chronically ill at baseline as she is essentially non-verbal and minimally responsive and s/p trach and PEG. She was brought into the ED after prolonged cardiac arrest with reported rhythm of PMVT, although she had subsequent PEA arrest in our ED. ECG post-code and in ED shows LBBB-type IVCD without meeting Sgarbossa criteria for STEMI. Although LBBB is new, it is difficult to determine whether this presentation represents an ACS event vs primary metabolic process (possible aspiration event vs arrhythmia 2/2 electrolyte abnormalities (eg K >6)). Additionally, given very chronically ill baseline with current DNR status and multiple relative contraindications for angiography (recent GIB prohibiting anticoagulation, Cr 3.3), it is unlikely that she would benefit from angiography or intervention. Case was discussed with Dr. Martinique  and CODE STEMI was cancelled. At this time, ongoing workup and treatment per primary team is recommended, as determined by evolving goals of care. In the meantime, medical management for possible MI is reasonable. -Continue workup per primary team -Can challenge with ASA and IV heparin gtt if felt to be same from GI perspective and based on goals of care  AF Not currently being anticoagulated due to prior GIB -Can consider heparin gtt as above    For questions or updates, please contact [place] Please consult [email or web address] for contact info under     Signed, Nila Nephew, MD  10/09/2019 8:07 PM

## 2019-09-28 NOTE — ED Notes (Signed)
Paged Chaplain for 2nd time

## 2019-09-28 NOTE — Progress Notes (Signed)
   09/15/2019 2309  Clinical Encounter Type  Visited With Patient;Family;Health care provider  Visit Type Initial;Critical Care;Patient actively dying  Referral From Nurse  Spiritual Encounters  Spiritual Needs Emotional;Grief support  Stress Factors  Family Stress Factors Health changes;Major life changes   Chaplain responded a patient who was post-CPR, and then turned to comfort care. Chaplain met with family (two sons and grandson) during medical consultation and assisted them with seeing the patient. Chaplain relayed next-of-kin contact information and funeral home information to RN. Chaplain extended hospitality. Spiritual care services available as needed.   Jeri Lager, Chaplain

## 2019-09-28 NOTE — ED Notes (Signed)
Both son's agreed that pt is now DNR.  Provider in room, informed RN.  Both sons are coming from [place] now.

## 2019-09-28 NOTE — Code Documentation (Signed)
Pulse check, present - stop CPR

## 2019-09-29 DIAGNOSIS — Z885 Allergy status to narcotic agent status: Secondary | ICD-10-CM | POA: Diagnosis not present

## 2019-09-29 DIAGNOSIS — Z9911 Dependence on respirator [ventilator] status: Secondary | ICD-10-CM | POA: Diagnosis not present

## 2019-09-29 DIAGNOSIS — I469 Cardiac arrest, cause unspecified: Secondary | ICD-10-CM | POA: Diagnosis present

## 2019-09-29 DIAGNOSIS — Z66 Do not resuscitate: Secondary | ICD-10-CM | POA: Diagnosis present

## 2019-09-29 DIAGNOSIS — G9340 Encephalopathy, unspecified: Secondary | ICD-10-CM | POA: Diagnosis present

## 2019-09-29 DIAGNOSIS — R579 Shock, unspecified: Secondary | ICD-10-CM | POA: Diagnosis present

## 2019-09-29 DIAGNOSIS — N185 Chronic kidney disease, stage 5: Secondary | ICD-10-CM | POA: Diagnosis present

## 2019-09-29 DIAGNOSIS — Z91040 Latex allergy status: Secondary | ICD-10-CM | POA: Diagnosis not present

## 2019-09-29 DIAGNOSIS — Z881 Allergy status to other antibiotic agents status: Secondary | ICD-10-CM | POA: Diagnosis not present

## 2019-09-29 DIAGNOSIS — E039 Hypothyroidism, unspecified: Secondary | ICD-10-CM | POA: Diagnosis present

## 2019-09-29 DIAGNOSIS — Z20828 Contact with and (suspected) exposure to other viral communicable diseases: Secondary | ICD-10-CM | POA: Diagnosis present

## 2019-09-29 DIAGNOSIS — Z888 Allergy status to other drugs, medicaments and biological substances status: Secondary | ICD-10-CM | POA: Diagnosis not present

## 2019-09-29 DIAGNOSIS — I482 Chronic atrial fibrillation, unspecified: Secondary | ICD-10-CM | POA: Diagnosis present

## 2019-09-29 DIAGNOSIS — Z791 Long term (current) use of non-steroidal anti-inflammatories (NSAID): Secondary | ICD-10-CM | POA: Diagnosis not present

## 2019-09-29 DIAGNOSIS — Z79899 Other long term (current) drug therapy: Secondary | ICD-10-CM | POA: Diagnosis not present

## 2019-09-29 DIAGNOSIS — E1122 Type 2 diabetes mellitus with diabetic chronic kidney disease: Secondary | ICD-10-CM | POA: Diagnosis present

## 2019-09-29 DIAGNOSIS — Z930 Tracheostomy status: Secondary | ICD-10-CM | POA: Diagnosis not present

## 2019-09-29 DIAGNOSIS — E875 Hyperkalemia: Secondary | ICD-10-CM | POA: Diagnosis present

## 2019-09-29 DIAGNOSIS — I4901 Ventricular fibrillation: Secondary | ICD-10-CM | POA: Diagnosis present

## 2019-09-29 DIAGNOSIS — D631 Anemia in chronic kidney disease: Secondary | ICD-10-CM | POA: Diagnosis present

## 2019-09-29 DIAGNOSIS — Z515 Encounter for palliative care: Secondary | ICD-10-CM | POA: Diagnosis present

## 2019-09-29 DIAGNOSIS — J9620 Acute and chronic respiratory failure, unspecified whether with hypoxia or hypercapnia: Secondary | ICD-10-CM | POA: Diagnosis present

## 2019-09-29 DIAGNOSIS — E871 Hypo-osmolality and hyponatremia: Secondary | ICD-10-CM | POA: Diagnosis present

## 2019-09-29 DIAGNOSIS — I447 Left bundle-branch block, unspecified: Secondary | ICD-10-CM | POA: Diagnosis present

## 2019-09-29 DIAGNOSIS — E662 Morbid (severe) obesity with alveolar hypoventilation: Secondary | ICD-10-CM | POA: Diagnosis present

## 2019-09-29 LAB — SARS CORONAVIRUS 2 BY RT PCR (HOSPITAL ORDER, PERFORMED IN ~~LOC~~ HOSPITAL LAB): SARS Coronavirus 2: NEGATIVE

## 2019-09-29 MED FILL — Medication: Qty: 1 | Status: AC

## 2019-10-13 NOTE — ED Notes (Signed)
Paged the Chaplain and Dr Nelda Marseille Oletta Darter) to dr Blanchie Serve

## 2019-10-13 NOTE — ED Provider Notes (Addendum)
I was called bedside to evaluate Shirley Malone. The patient was found to be unresponsive. She was not breathing over the ventilator. Asystole was noted on the cardiac monitor. The patient was without heart tones, blood pressure, or corneal reflexes. The patient was pronounced dead at Mar 20, 1206 AM on 13-Oct-2019. The patient's attending physician, Dr. Tamera Punt was notified. Family was present bedside and is aware that the patient is deceased.    Regan Lemming, MD 10-13-19 [id number]    Regan Lemming, MD 10/13/19 1233    Malvin Johns, MD 2019/10/13 1415

## 2019-10-13 NOTE — Progress Notes (Signed)
50 mg/5 mL of IV hydromorphone wasted in pharmacy. Anitra Dillard, CPhT, was witness.

## 2019-10-13 NOTE — Consult Note (Signed)
NAME:  Shirley Malone, MRN:  [id number], DOB:  [birth year], LOS: 0 ADMISSION DATE:  09/27/2019, CONSULTATION DATE:  09/27/2019 REFERRING MD:  Dr. Armandina Gemma, CHIEF COMPLAINT:  Cardiac arrest  Brief History   78 year old female presenting from [facility] with prolonged cardiac arrest and re-arrest in ER who remained hypotensive with SBP in the 60's despite max epi drip.  In ER, family decided to transition to partial comfort care, except for removal from ventilator, therefore PCCM called for admit.    History of present illness   HPI obtained from medical chart review as patient is unresponsive on mechanical ventilation.   78 year old female with past medical history of chronic respiratory failure with trach (TC during day/ nightly MV), tracheobronchomalacia, A.fib (previously on apixaban but stopped due to GIBs), dCHF, DVT RLE s/p anticoagulation and IVC filter, GI bleeding, anemia, CKD, obesity, hypothyroidism, DM2, pseudomonas pneumonia (5/20) and E.coli UTI, and OSA presenting from [facility] with cardiac arrest.   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   Presenting from [place] around 1900 after being found unresponsive and pulseless, last known well around 1730.  Patient had priorly been a DNR but family rescinded and CPR performed with 5 epi's.  Found in [place] with EMS with conversion to torsades and given additional 6 epi, Amio, mag, and calcium and placed on Epi gtt.  In ER, patient re-arrested with PEA with ROSC after 2 mins.  She remained hypotensive despite epinephrine gtt at 15 mcg/min.  In ER, her two sons decided for DNR and to  transition to comfort care.  PCCM called for admit as they did not want her removed from ventilator but all other comfort measures.   Past Medical History  chronic respiratory failure s/p trach (TC during day/ nightly MV), tracheobronchomalacia, A.fib (previously on apixaban but stopped due to GIBs), dCHF, DVT RLE s/p anticoagulation and IVC filter, GI bleeding, anemia, CKD, obesity, hypothyroidism, DM2, pseudomonas pneumonia (5/20) and E.coli UTI, OSA   Significant Hospital Events   1/0/18 cardiac arrest  Consults:   Procedures:  PTA trach   Significant Diagnostic Tests:   Micro Data:  10/17 SARS CoV2 >> neg  Antimicrobials:   Interim history/subjective:  Epi gtt off/ remains on MV.  BP liable  Objective   Blood pressure 113/88, pulse 60, temperature (!) 96 F (35.6 C), temperature source Tympanic, resp. rate (!) 9, height 5\' 2"  (1.575 m), weight 81.4 kg.    Vent Mode: PRVC FiO2 (%):  [60 %-100 %] 60 % Set Rate:  [18 bmp] 18 bmp Vt Set:  [500 mL] 500 mL PEEP:  [5 cmH20] 5 cmH20 Plateau Pressure:  [15 cmH20-18 cmH20] 15 cmH20  No intake or output data in the 24 hours ending 10-02-19 0110 Filed Weights   10/02/2019 1930 2019-10-02 0011  Weight: 79.4 kg 81.4 kg   Examination: General:  Chronically ill appearing female unresponsive on MV HEENT: MM pink/moist, pupils irregular, dilated/ fixed, absent corneal reflexes, midline trach Neuro: unresponsive CV: rr, distant heart sounds, no radial/ pedial pulses PULM:  spont vent triggered breaths, no gag or cough, coarse anteriorly  GI: obese, soft, no BS appreciated Extremities: cool/dry,  no le edema  Skin: no rashes   Resolved Hospital Problem list    Assessment & Plan:   Prolonged cardiac arrest with encephalopathy  Shock- likely multifactorial  Acute on chronic respiratory failure with tracheostomy  Hyperkalemia Anemia Hyponatremia Leukocytosis  CKD stage 5 P:  Patient is actively dying.  Epi gtt has already been  stopped.  BP remains labile with SBP 30-60. Family spoken with at bedside and in family room on plan of care.  One of her son's, Lake Bells, wishes for her not to be removed from vent as he suffers from PTSD with asthma and does not want her to "gasp for air" or feel like she is suffocating.  I relayed this was likely prolonging her suffering and passing and with appropriate medications, we would try and prevent this, however he feels strongly about this.    Therefore, morphine bolus and gtt started with prn benadryl, ativan, and robinul.  I decreased her full MV support to PS with minimal settings and she remained on the ventilator.  Patient was to be admitted to [facility], however she shortly passed away in the ER with Cornelia Copa, her son, at the bedside.  Lake Bells did not wish to be in room.   Labs   CBC: Recent Labs  Lab 09/22/2019 1925 09/17/2019 1927 10/01/2019 1935  WBC 24.5*  --   --   NEUTROABS 16.5*  --   --   HGB 9.7* 10.5* 9.9*  HCT 32.0* 31.0* 29.0*  MCV 93.3  --   --   PLT 220  --   --     Basic Metabolic Panel: Recent Labs  Lab 09/22/2019 1925 10/06/2019 1927 09/15/2019 1935  NA 126* 123* 121*  K 6.5* 6.2* 6.9*  CL 93* 101  --   CO2 8*  --   --   GLUCOSE 139* 136*  --   BUN 97* 104*  --   CREATININE 3.38* 3.30*  --   CALCIUM 10.2  --   --   MG 3.0*  --   --    GFR: Estimated Creatinine Clearance: 14.3 mL/min (A) (by C-G formula based on SCr of 3.3 mg/dL (H)). Recent Labs  Lab 09/19/2019 1925  WBC 24.5*    Liver Function Tests: No results for input(s): AST, ALT, ALKPHOS, BILITOT, PROT, ALBUMIN in the last 168 hours. No results for input(s): LIPASE, AMYLASE in the last 168 hours. No results for input(s): AMMONIA in the last 168 hours.  ABG    Component Value Date/Time   PHART 7.087 (LL) 09/27/2019 1935   PCO2ART 44.4 09/14/2019 1935   PO2ART 427.0 (H) 10/06/2019 1935   HCO3 13.7 (L) 09/20/2019 1935   TCO2 15 (L) 10/08/2019 1935   ACIDBASEDEF 16.0 (H) 10/02/2019 1935   O2SAT  100.0 09/19/2019 1935     Coagulation Profile: No results for input(s): INR, PROTIME in the last 168 hours.  Cardiac Enzymes: No results for input(s): CKTOTAL, CKMB, CKMBINDEX, TROPONINI in the last 168 hours.  HbA1C: Hgb A1c MFr Bld  Date/Time Value Ref Range Status  07/28/2017 04:11 PM 5.8 (H) 4.8 - 5.6 % Final    Comment:    (NOTE) Pre diabetes:          5.7%-6.4% Diabetes:              >6.4% Glycemic control for   <7.0% adults with diabetes     CBG: No results for input(s): GLUCAP in the last 168 hours.  Review of Systems:  Unable  Past Medical History  She,  has a past medical history of Anemia, Atrial fibrillation ([place]), Atrial fibrillation, chronic ([place]) (09/17/2019), Chronic kidney disease, Chronic respiratory failure ([place]), Diabetes mellitus without complication ([place]), DVT (deep venous thrombosis) ([place]), Gastrointestinal bleed, Morbid obesity ([place]), Obesity hypoventilation syndrome ([place]), and Thyroid disease.   Surgical History    Past Surgical History:  Procedure Laterality Date  . PEG TUBE PLACEMENT    . TRACHEOSTOMY       Social History   reports that she has never smoked. She has never used smokeless tobacco. She reports previous alcohol use. She reports previous drug use.   Family History   Her family history is not on file.   Allergies Allergies  Allergen Reactions  . Clarithromycin Itching and Rash  . Codeine Itching  . Latex Hives and Other (See Comments)    Blisters, also   . Adhesive [Tape] Other (See Comments)    "Allergic," per [facility]     Home Medications  Prior to Admission medications   Medication Sig Start Date End Date Taking? Authorizing Provider  acetaminophen (TYLENOL) 325 MG tablet Place 650 mg into feeding tube every 8 (eight) hours as needed for mild pain, fever or headache.   Yes [provider]  ACIDOPHILUS LACTOBACILLUS PO Place 1 tablet into feeding tube every 12 (twelve) hours.    Yes [provider]   busPIRone (BUSPAR) 5 MG tablet Place 5 mg into feeding tube every 12 (twelve) hours.   Yes [provider]  carvedilol (COREG) 6.25 MG tablet 1 tablet (6.25 mg total) by Per J Tube route 2 (two) times daily with a meal. Patient taking differently: Place 6.25 mg into feeding tube 2 (two) times daily with a meal.  08/19/19  Yes Adhikari, Amrit, MD  cetirizine (ZYRTEC) 10 MG tablet Place 10 mg into feeding tube daily.    Yes [provider]  chlorhexidine (PERIDEX) 0.12 % solution Use as directed 15 mLs in the mouth or throat 2 (two) times daily.   Yes [provider]  Cholecalciferol (VITAMIN D3) 50 MCG (2000 UT) TABS Place 2,000 Units into feeding tube daily.    Yes [provider]  Darbepoetin Alfa (ARANESP) 25 MCG/0.42ML SOSY injection Inject 25 mcg into the skin every 7 (seven) days.   Yes [provider]  ferrous sulfate 325 (65 FE) MG tablet 325 mg See admin instructions. 325 mg per tube in the morning   Yes [provider]  folic acid (FOLVITE) 1 MG tablet Place 1 mg into feeding tube daily.    Yes [provider]  hydrOXYzine (ATARAX/VISTARIL) 25 MG tablet Place 25 mg into feeding tube every 8 (eight) hours as needed for itching.    Yes [provider]  ibuprofen (ADVIL) 600 MG tablet Place 600 mg into feeding tube every 12 (twelve) hours as needed (for right shoulder pain).    Yes [provider]  ipratropium-albuterol (DUONEB) 0.5-2.5 (3) MG/3ML SOLN Take 3 mLs by nebulization every 6 (six) hours as needed ("for acute and chronic respiratory failure with hypercapnia").   Yes [provider]  levothyroxine (SYNTHROID) 100 MCG tablet Place 100 mcg into feeding tube daily before breakfast.    Yes [provider]  loperamide (IMODIUM A-D) 2 MG tablet Place 2 mg into feeding tube daily.    Yes [provider]  LORazepam (ATIVAN) 0.5 MG tablet Place 0.5 mg into feeding tube daily as needed for  anxiety.   Yes [provider]  Melatonin 3 MG TABS Place 3 mg into feeding tube at bedtime.    Yes [provider]  Multiple Vitamin (MULTIVITAMIN WITH MINERALS) TABS tablet Place 1 tablet into feeding tube daily.    Yes [provider]  nitroGLYCERIN (NITROSTAT) 0.4 MG SL tablet Place 0.4 mg under the tongue every 5 (five) minutes as needed for chest pain. 01/17/19  Yes [provider]  Nutritional Supplements (DIABETISOURCE) LIQD Place 60 mL/hr into feeding tube continuous.   Yes [provider]  pantoprazole (PROTONIX) 40 MG tablet Take 1 tablet (40 mg total) by mouth 2 (two) times daily. Patient taking differently: 40 mg See admin instructions. 40 mg per tube two times a day 08/01/19  Yes Adhikari, Tamsen Meek, MD  sertraline (ZOLOFT) 100 MG tablet Place 100 mg into feeding tube daily.    Yes [provider]  Water For Irrigation, Sterile (FREE WATER) SOLN Place 250 mLs into feeding tube every 4 (four) hours.   Yes [provider]  phenol (CHLORASEPTIC) 1.4 % LIQD Use as directed 1 spray in the mouth or throat every 12 (twelve) hours.    [provider]     CRITICAL CARE Performed by: Kennieth Rad     Total critical care time: 40 minutes   Critical care time was exclusive of separately billable procedures and treating other patients.   Critical care was necessary to treat or prevent imminent or life-threatening deterioration.   Critical care was time spent personally by me on the following activities: development of treatment plan with patient and/or surrogate as well as nursing, discussions with consultants, evaluation of patient's response to treatment, examination of patient, obtaining history from patient or surrogate, ordering and performing treatments and interventions, ordering and review of laboratory studies, ordering and review of radiographic studies, pulse oximetry and re-evaluation of patient's condition.   Kennieth Rad, MSN, AGACNP-BC [facility] Pgr: [phone] or if no answer [phone] Sep 30, 2019, 12:14 AM

## 2019-10-13 NOTE — ED Notes (Signed)
Time of death 0007 pronounced by Dr. Regan Lemming, family, RN Kathie Rhodes and ICU provider also bedside.

## 2019-10-13 DEATH — deceased

## 2020-04-24 IMAGING — DX DG CHEST 1V PORT
1 series · 1 of 1 positions shown · non-contrast
Comparison: 07/30/2019, 08/27/2017

CLINICAL DATA: Low blood pressure

EXAM:
PORTABLE CHEST 1 VIEW

[chest]
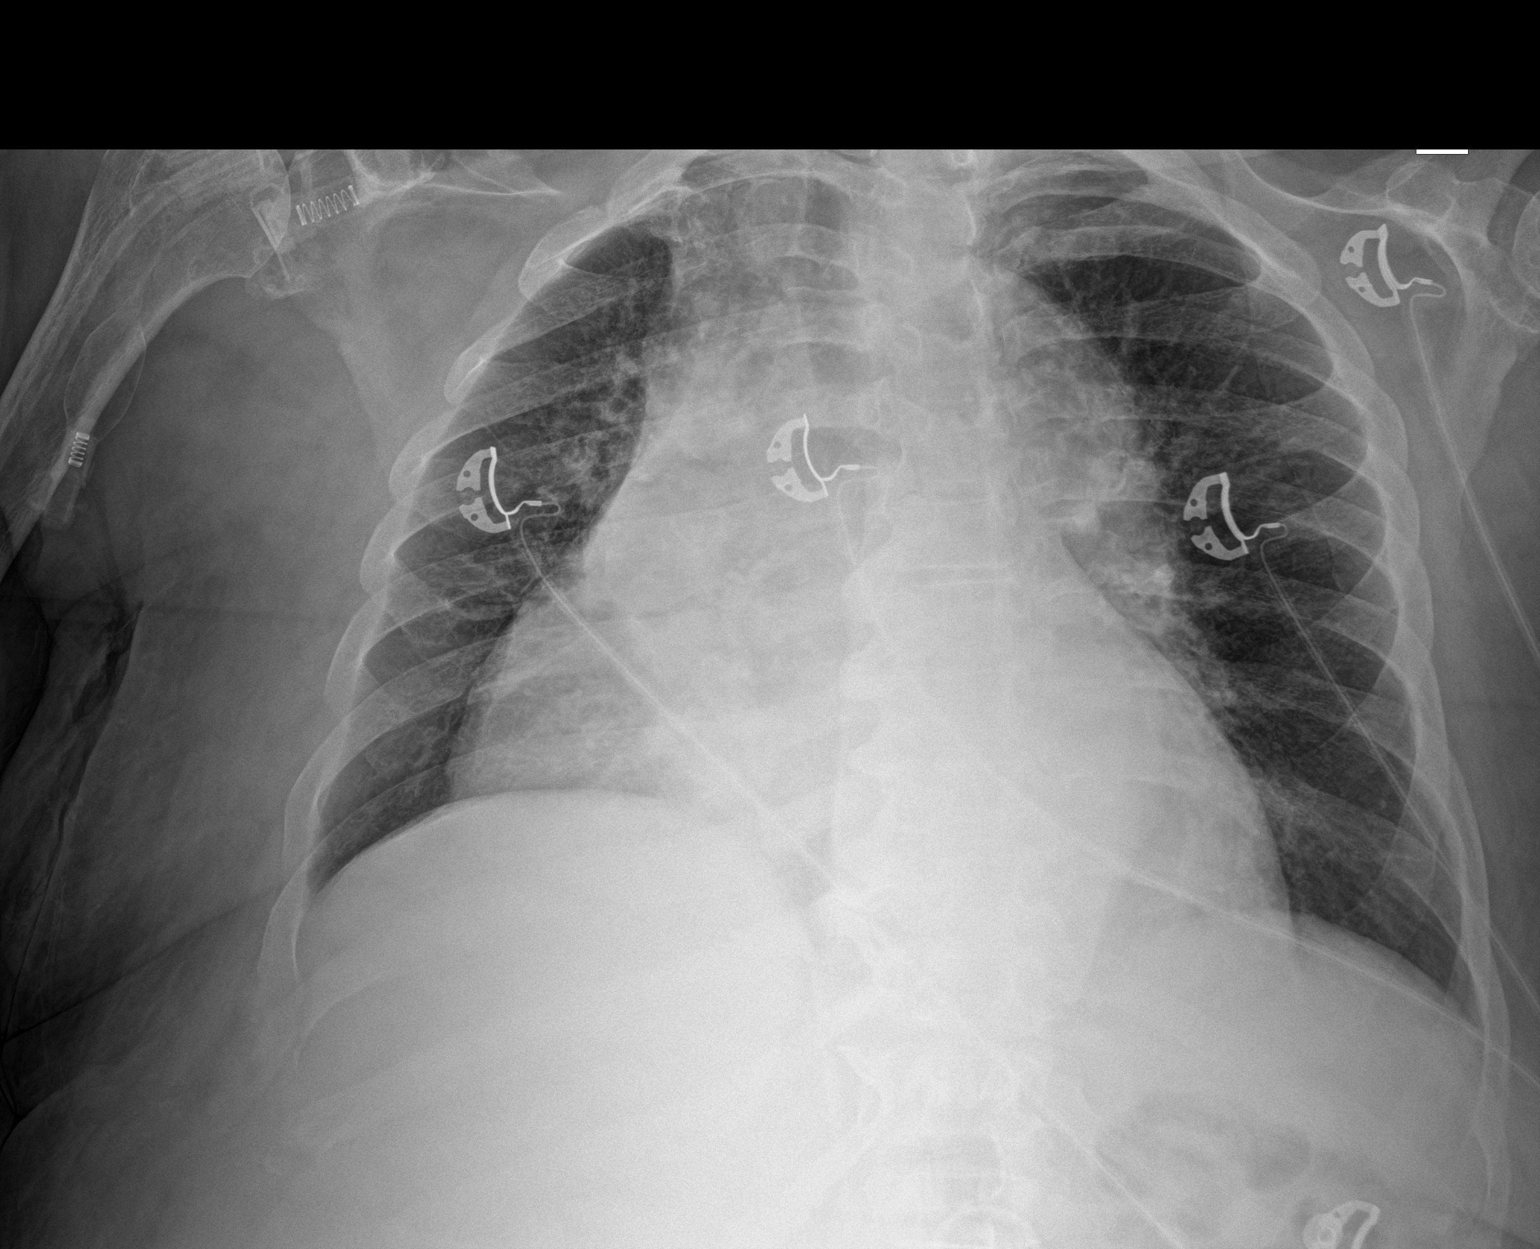

[1 of 1 positions shown; findings below may reference images not displayed]

FINDINGS: Tip of the tracheostomy tube appears at the thoracic inlet, slightly
prominent convex distension of the tracheostomy balloon at the
thoracic inlet. Marked cardiomegaly without overt failure. No large
effusion. No focal consolidation.
IMPRESSION: 1. Tracheostomy tube tip at the thoracic inlet with slight convex
lucency around the tip suggesting prominent distention of
tracheostomy balloon.
2. Cardiomegaly without edema or infiltrate.

## 2020-06-07 IMAGING — DX DG CHEST 1V PORT
1 series · 1 of 1 positions shown · non-contrast
Comparison: September 17, 2019

CLINICAL DATA: Code STEMI.  Post CPR.  Patient found unresponsive.

EXAM:
PORTABLE CHEST 1 VIEW

[chest ap]
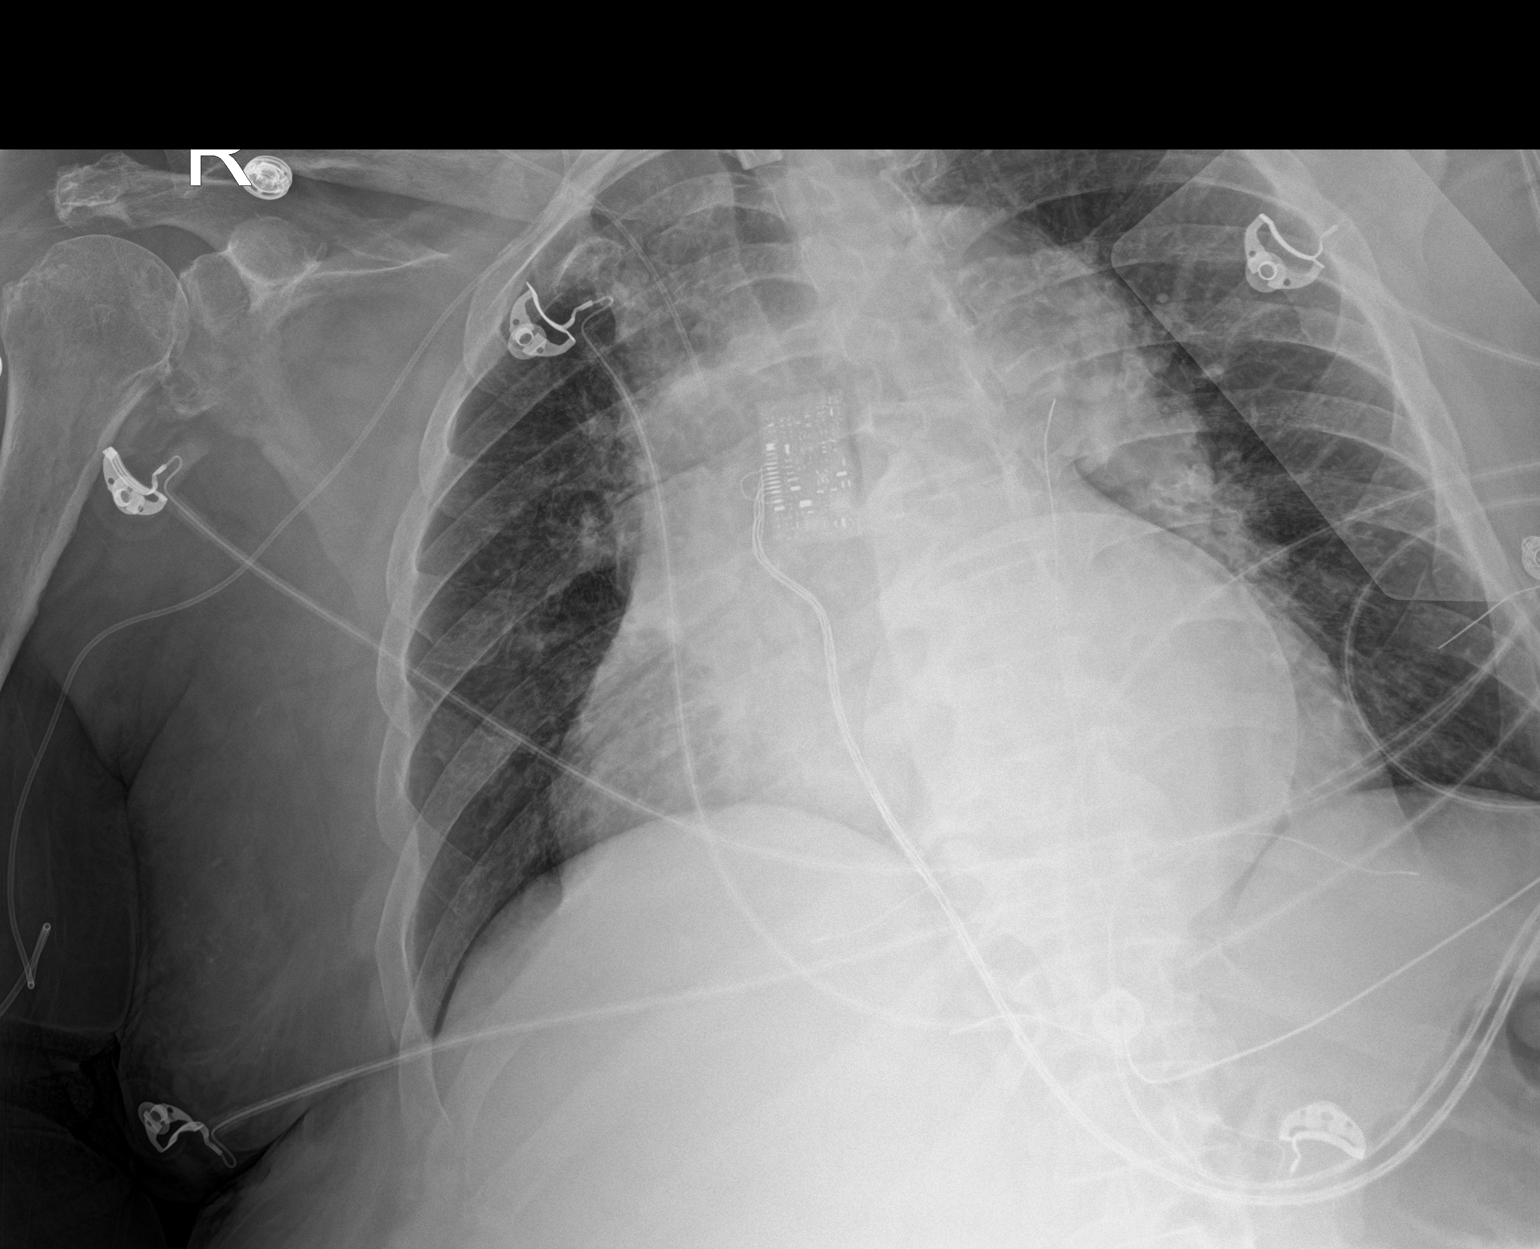

[1 of 1 positions shown; findings below may reference images not displayed]

FINDINGS: The tracheostomy tube terminates in good position. A right PICC line
projects over the peripheral SVC. There is a loop within the PICC
line just superior to the clavicle. Transcutaneous pacer leads
project on the left. Cardiomegaly is stable. A tortuous thoracic
aorta and mediastinum are stable. No pneumothorax. No focal
infiltrates or pulmonary edema identified.
IMPRESSION: 1. There is a a loop within the right PICC line projected just
superior to the medial clavicle. Consider repositioning. The distal
tip is in the peripheral SVC.
2. Cardiomegaly and tortuous thoracic aorta.
3. No edema.  No pneumothorax.
